# Patient Record
Sex: Male | Born: 1955 | Race: Black or African American | Hispanic: No | Smoking: Current every day smoker
Health system: Southern US, Community
[De-identification: ages and names within clinical notes are randomized; demographics above are authoritative.]

---

## 2018-09-10 ENCOUNTER — Emergency Department (HOSPITAL_COMMUNITY)
Admission: EM | Admit: 2018-09-10 | Discharge: 2018-09-11 | Disposition: A | Discharge status: Home / Self Care | Payer: Medicaid Other | Attending: Emergency Medicine | Admitting: Emergency Medicine

## 2018-09-10 ENCOUNTER — Encounter (HOSPITAL_COMMUNITY): Payer: Self-pay | Admitting: Emergency Medicine

## 2018-09-10 ENCOUNTER — Emergency Department (HOSPITAL_COMMUNITY): Payer: Medicaid Other

## 2018-09-10 ENCOUNTER — Other Ambulatory Visit: Payer: Self-pay

## 2018-09-10 DIAGNOSIS — M25512 Pain in left shoulder: Secondary | ICD-10-CM | POA: Diagnosis present

## 2018-09-10 DIAGNOSIS — Z5321 Procedure and treatment not carried out due to patient leaving prior to being seen by health care provider: Secondary | ICD-10-CM | POA: Insufficient documentation

## 2018-09-10 LAB — CBC
HCT: 41.5 % (ref 39.0–52.0)
Hemoglobin: 14 g/dL (ref 13.0–17.0)
MCH: 33.6 pg (ref 26.0–34.0)
MCHC: 33.7 g/dL (ref 30.0–36.0)
MCV: 99.5 fL (ref 80.0–100.0)
Platelets: 286 10*3/uL (ref 150–400)
RBC: 4.17 MIL/uL — ABNORMAL LOW (ref 4.22–5.81)
RDW: 13 % (ref 11.5–15.5)
WBC: 6.7 10*3/uL (ref 4.0–10.5)
nRBC: 0 % (ref 0.0–0.2)

## 2018-09-10 LAB — BASIC METABOLIC PANEL
Anion gap: 12 (ref 5–15)
BUN: 14 mg/dL (ref 8–23)
CO2: 23 mmol/L (ref 22–32)
Calcium: 9.6 mg/dL (ref 8.9–10.3)
Chloride: 103 mmol/L (ref 98–111)
Creatinine, Ser: 1.14 mg/dL (ref 0.61–1.24)
GFR calc Af Amer: >60 mL/min (ref >60)
GFR calc non Af Amer: >60 mL/min (ref >60)
Glucose, Bld: 99 mg/dL (ref 70–99)
Potassium: 4.1 mmol/L (ref 3.5–5.1)
Sodium: 138 mmol/L (ref 135–145)

## 2018-09-10 LAB — TROPONIN I (HIGH SENSITIVITY)
Troponin I (High Sensitivity): 5 ng/L (ref <18)
Troponin I (High Sensitivity): 4 ng/L (ref <18)

## 2018-09-10 MED ORDER — SODIUM CHLORIDE 0.9% FLUSH: 3.0000 mL | Freq: Once | INTRAVENOUS | Status: DC

## 2018-09-10 NOTE — ED Triage Notes (Signed)
Pt states he has bilateral shoulder pain for approx a month. Pt states the pain goes into his left side.Denies SOB/CP

## 2018-09-11 NOTE — ED Notes (Signed)
Pt has been called x 3 in lobby and outside, no answer

## 2020-08-10 ENCOUNTER — Other Ambulatory Visit: Payer: Self-pay

## 2020-08-10 ENCOUNTER — Emergency Department (HOSPITAL_COMMUNITY): Payer: Medicaid Other

## 2020-08-10 ENCOUNTER — Observation Stay (HOSPITAL_COMMUNITY): Payer: Medicaid Other

## 2020-08-10 ENCOUNTER — Encounter (HOSPITAL_COMMUNITY): Payer: Self-pay | Admitting: Internal Medicine

## 2020-08-10 ENCOUNTER — Observation Stay (HOSPITAL_COMMUNITY)
Admission: EM | Admit: 2020-08-10 | Discharge: 2020-08-11 | Disposition: A | Discharge status: Home / Self Care | Payer: Medicaid Other | Attending: Internal Medicine | Admitting: Internal Medicine

## 2020-08-10 DIAGNOSIS — D72829 Elevated white blood cell count, unspecified: Secondary | ICD-10-CM | POA: Diagnosis not present

## 2020-08-10 DIAGNOSIS — N179 Acute kidney failure, unspecified: Secondary | ICD-10-CM | POA: Diagnosis not present

## 2020-08-10 DIAGNOSIS — Z7901 Long term (current) use of anticoagulants: Secondary | ICD-10-CM | POA: Insufficient documentation

## 2020-08-10 DIAGNOSIS — R7401 Elevation of levels of liver transaminase levels: Secondary | ICD-10-CM

## 2020-08-10 DIAGNOSIS — Z79899 Other long term (current) drug therapy: Secondary | ICD-10-CM | POA: Insufficient documentation

## 2020-08-10 DIAGNOSIS — G9389 Other specified disorders of brain: Secondary | ICD-10-CM | POA: Insufficient documentation

## 2020-08-10 DIAGNOSIS — R569 Unspecified convulsions: Secondary | ICD-10-CM

## 2020-08-10 DIAGNOSIS — Z20822 Contact with and (suspected) exposure to covid-19: Secondary | ICD-10-CM | POA: Insufficient documentation

## 2020-08-10 DIAGNOSIS — Z87820 Personal history of traumatic brain injury: Secondary | ICD-10-CM | POA: Insufficient documentation

## 2020-08-10 DIAGNOSIS — R561 Post traumatic seizures: Principal | ICD-10-CM | POA: Insufficient documentation

## 2020-08-10 DIAGNOSIS — K0889 Other specified disorders of teeth and supporting structures: Secondary | ICD-10-CM | POA: Insufficient documentation

## 2020-08-10 DIAGNOSIS — F17210 Nicotine dependence, cigarettes, uncomplicated: Secondary | ICD-10-CM | POA: Diagnosis not present

## 2020-08-10 DIAGNOSIS — F10929 Alcohol use, unspecified with intoxication, unspecified: Secondary | ICD-10-CM | POA: Diagnosis not present

## 2020-08-10 LAB — I-STAT CHEM 8, ED
BUN: 10 mg/dL (ref 8–23)
Calcium, Ion: 1.1 mmol/L — ABNORMAL LOW (ref 1.15–1.40)
Chloride: 105 mmol/L (ref 98–111)
Creatinine, Ser: 1.6 mg/dL — ABNORMAL HIGH (ref 0.61–1.24)
Glucose, Bld: 174 mg/dL — ABNORMAL HIGH (ref 70–99)
HCT: 45 % (ref 39.0–52.0)
Hemoglobin: 15.3 g/dL (ref 13.0–17.0)
Potassium: 4.2 mmol/L (ref 3.5–5.1)
Sodium: 137 mmol/L (ref 135–145)
TCO2: 22 mmol/L (ref 22–32)

## 2020-08-10 LAB — CBC
HCT: 43.9 % (ref 39.0–52.0)
Hemoglobin: 14.8 g/dL (ref 13.0–17.0)
MCH: 33.2 pg (ref 26.0–34.0)
MCHC: 33.7 g/dL (ref 30.0–36.0)
MCV: 98.4 fL (ref 80.0–100.0)
Platelets: 226 10*3/uL (ref 150–400)
RBC: 4.46 MIL/uL (ref 4.22–5.81)
RDW: 14.4 % (ref 11.5–15.5)
WBC: 13.2 10*3/uL — ABNORMAL HIGH (ref 4.0–10.5)
nRBC: 0 % (ref 0.0–0.2)

## 2020-08-10 LAB — RESP PANEL BY RT-PCR (FLU A&B, COVID) ARPGX2
Influenza A by PCR: NEGATIVE
Influenza B by PCR: NEGATIVE
SARS Coronavirus 2 by RT PCR: NEGATIVE

## 2020-08-10 LAB — URINALYSIS, ROUTINE W REFLEX MICROSCOPIC
Bacteria, UA: NONE SEEN
Bilirubin Urine: NEGATIVE
Glucose, UA: NEGATIVE mg/dL
Ketones, ur: NEGATIVE mg/dL
Leukocytes,Ua: NEGATIVE
Nitrite: NEGATIVE
Protein, ur: 100 mg/dL — AB
Specific Gravity, Urine: 1.015 (ref 1.005–1.030)
pH: 5 (ref 5.0–8.0)

## 2020-08-10 LAB — PROTIME-INR
INR: 1 (ref 0.8–1.2)
Prothrombin Time: 13.2 seconds (ref 11.4–15.2)

## 2020-08-10 LAB — DIFFERENTIAL
Abs Immature Granulocytes: 0.12 10*3/uL — ABNORMAL HIGH (ref 0.00–0.07)
Basophils Absolute: 0 10*3/uL (ref 0.0–0.1)
Basophils Relative: 0 %
Eosinophils Absolute: 0.1 10*3/uL (ref 0.0–0.5)
Eosinophils Relative: 1 %
Immature Granulocytes: 1 %
Lymphocytes Relative: 7 %
Lymphs Abs: 1 10*3/uL (ref 0.7–4.0)
Monocytes Absolute: 0.4 10*3/uL (ref 0.1–1.0)
Monocytes Relative: 3 %
Neutro Abs: 11.6 10*3/uL — ABNORMAL HIGH (ref 1.7–7.7)
Neutrophils Relative %: 88 %

## 2020-08-10 LAB — COMPREHENSIVE METABOLIC PANEL
ALT: 55 U/L — ABNORMAL HIGH (ref 0–44)
AST: 49 U/L — ABNORMAL HIGH (ref 15–41)
Albumin: 3.8 g/dL (ref 3.5–5.0)
Alkaline Phosphatase: 79 U/L (ref 38–126)
Anion gap: 16 — ABNORMAL HIGH (ref 5–15)
BUN: 9 mg/dL (ref 8–23)
CO2: 20 mmol/L — ABNORMAL LOW (ref 22–32)
Calcium: 9.5 mg/dL (ref 8.9–10.3)
Chloride: 100 mmol/L (ref 98–111)
Creatinine, Ser: 1.67 mg/dL — ABNORMAL HIGH (ref 0.61–1.24)
GFR, Estimated: 45 mL/min — ABNORMAL LOW (ref >60)
Glucose, Bld: 183 mg/dL — ABNORMAL HIGH (ref 70–99)
Potassium: 4.2 mmol/L (ref 3.5–5.1)
Sodium: 136 mmol/L (ref 135–145)
Total Bilirubin: 0.7 mg/dL (ref 0.3–1.2)
Total Protein: 7.5 g/dL (ref 6.5–8.1)

## 2020-08-10 LAB — RAPID URINE DRUG SCREEN, HOSP PERFORMED
Amphetamines: NOT DETECTED
Barbiturates: NOT DETECTED
Benzodiazepines: POSITIVE — AB
Cocaine: NOT DETECTED
Opiates: NOT DETECTED
Tetrahydrocannabinol: NOT DETECTED

## 2020-08-10 LAB — SODIUM, URINE, RANDOM: Sodium, Ur: 116 mmol/L

## 2020-08-10 LAB — CREATININE, URINE, RANDOM: Creatinine, Urine: 152.02 mg/dL

## 2020-08-10 LAB — ETHANOL: Alcohol, Ethyl (B): <10 mg/dL (ref <10)

## 2020-08-10 LAB — APTT: aPTT: 23 seconds — ABNORMAL LOW (ref 24–36)

## 2020-08-10 LAB — CBG MONITORING, ED: Glucose-Capillary: 194 mg/dL — ABNORMAL HIGH (ref 70–99)

## 2020-08-10 MED ORDER — THIAMINE HCL 100 MG/ML IJ SOLN
500.0000 mg | Freq: Three times a day (TID) | INTRAVENOUS | Status: DC
  Administered 2020-08-11 (×2): 500 mg via INTRAVENOUS
  Filled 2020-08-10 (×7): qty 5

## 2020-08-10 MED ORDER — NICOTINE 7 MG/24HR TD PT24
7.0000 mg | MEDICATED_PATCH | Freq: Every day | TRANSDERMAL | Status: DC
  Administered 2020-08-10 – 2020-08-11 (×2): 7 mg via TRANSDERMAL
  Filled 2020-08-10 (×2): qty 1

## 2020-08-10 MED ORDER — LEVETIRACETAM IN NACL 1000 MG/100ML IV SOLN
1000.0000 mg | Freq: Once | INTRAVENOUS | Status: AC
  Administered 2020-08-10: 1000 mg via INTRAVENOUS
  Filled 2020-08-10: qty 100

## 2020-08-10 MED ORDER — ACETAMINOPHEN 650 MG RE SUPP: 650.0000 mg | RECTAL | Status: DC | PRN

## 2020-08-10 MED ORDER — THIAMINE HCL 100 MG/ML IJ SOLN: 250.0000 mg | Freq: Every day | INTRAVENOUS | Status: DC

## 2020-08-10 MED ORDER — THIAMINE HCL 100 MG PO TABS: 100.0000 mg | ORAL_TABLET | Freq: Every day | ORAL | Status: DC

## 2020-08-10 MED ORDER — LORAZEPAM 1 MG PO TABS: 1.0000 mg | ORAL_TABLET | ORAL | Status: DC | PRN

## 2020-08-10 MED ORDER — ONDANSETRON HCL 4 MG PO TABS
4.0000 mg | ORAL_TABLET | Freq: Four times a day (QID) | ORAL | Status: DC | PRN
Start: 2020-08-10 — End: 2020-08-11

## 2020-08-10 MED ORDER — ADULT MULTIVITAMIN W/MINERALS CH
1.0000 | ORAL_TABLET | Freq: Every day | ORAL | Status: DC
  Administered 2020-08-11: 1 via ORAL
  Filled 2020-08-10: qty 1

## 2020-08-10 MED ORDER — ACETAMINOPHEN 325 MG PO TABS
650.0000 mg | ORAL_TABLET | ORAL | Status: DC | PRN
  Administered 2020-08-10: 650 mg via ORAL
  Filled 2020-08-10: qty 2

## 2020-08-10 MED ORDER — LEVETIRACETAM IN NACL 500 MG/100ML IV SOLN
500.0000 mg | Freq: Two times a day (BID) | INTRAVENOUS | Status: DC
  Administered 2020-08-11 (×2): 500 mg via INTRAVENOUS
  Filled 2020-08-10 (×3): qty 100

## 2020-08-10 MED ORDER — LORAZEPAM 2 MG/ML IJ SOLN
1.0000 mg | INTRAMUSCULAR | Status: DC | PRN
  Administered 2020-08-10: 1 mg via INTRAVENOUS
  Filled 2020-08-10: qty 1

## 2020-08-10 MED ORDER — ORAL CARE MOUTH RINSE
15.0000 mL | Freq: Two times a day (BID) | OROMUCOSAL | Status: DC
  Administered 2020-08-11: 15 mL via OROMUCOSAL

## 2020-08-10 MED ORDER — LIDOCAINE VISCOUS HCL 2 % MT SOLN
15.0000 mL | OROMUCOSAL | Status: DC | PRN
  Administered 2020-08-10 – 2020-08-11 (×2): 15 mL via OROMUCOSAL
  Filled 2020-08-10 (×3): qty 15

## 2020-08-10 MED ORDER — FOLIC ACID 1 MG PO TABS
1.0000 mg | ORAL_TABLET | Freq: Every day | ORAL | Status: DC
  Administered 2020-08-11: 1 mg via ORAL
  Filled 2020-08-10: qty 1

## 2020-08-10 MED ORDER — LACTATED RINGERS IV BOLUS
1000.0000 mL | Freq: Once | INTRAVENOUS | Status: AC
  Administered 2020-08-10: 1000 mL via INTRAVENOUS

## 2020-08-10 MED ORDER — ONDANSETRON HCL 4 MG/2ML IJ SOLN: 4.0000 mg | Freq: Four times a day (QID) | INTRAMUSCULAR | Status: DC | PRN

## 2020-08-10 MED ORDER — ENOXAPARIN SODIUM 40 MG/0.4ML IJ SOSY
40.0000 mg | PREFILLED_SYRINGE | INTRAMUSCULAR | Status: DC
  Administered 2020-08-10: 40 mg via SUBCUTANEOUS
  Filled 2020-08-10: qty 0.4

## 2020-08-10 MED ORDER — THIAMINE HCL 100 MG/ML IJ SOLN: 100.0000 mg | Freq: Every day | INTRAMUSCULAR | Status: DC

## 2020-08-10 NOTE — ED Notes (Signed)
Admitting paged to RN per her request 

## 2020-08-10 NOTE — ED Notes (Signed)
Report called to 3W given to [place], [place]

## 2020-08-10 NOTE — ED Notes (Signed)
Brad Jenkins, pt's daughter contact info: [phone]

## 2020-08-10 NOTE — ED Notes (Signed)
Pt transported to MRI 

## 2020-08-10 NOTE — Progress Notes (Signed)
Per RN, patient is unavailable for EEG.  She asked that we check with her in half an hour.

## 2020-08-10 NOTE — ED Notes (Signed)
This RN witnessed pt attempting to get out of bed. Pt gathering belongings and wanting, "to go home". Pt difficult to redirect. Pt daughter called. Pt appears amiable to staying at this time.

## 2020-08-10 NOTE — Consult Note (Addendum)
NEUROLOGY CONSULTATION NOTE   Date of service: August 10, 2020 Patient Name: Brad Jenkins MRN:  [id number] DOB:  03-13-1955 Reason for consult: "Seizures, hx of TBI and EtOH use" Requesting Provider: Gust Rung, DO _ _ _   _ __   _ __ _ _  __ __   _ __   __ _  History of Present Illness  Brad Jenkins is a 65 y.o. male with PMH significant for prior TBI in 1997 and several other instances of head trauma with R more than L bi-frontal encephalomalacia on imaging, hx of EtOH use ~3-4 beers a day, last drink was the night before presentation, hx of seizures since he was a kid for which he follows with a neurologist but I do not have access to the relevant notes and is not on any AEDs, he was brought in by EMS for seizures x 2.  Daughter was at home and in the morning, found him on the ground twitching. EMS arrived and he was still seizing. The seizure resolved prior to any medications. He had a second seizure enroute and was given 5mg  of Midazolam with resolution of seizures. The second seizure lasted 30-45 secs. He was post ictal on arrival to ED. Reports approximately 1 seizure a year since the TBI.  Patient has very poor recollection of the actual event. Overall, he is a very poor historian and looks to his daughter and son to help with answering pretty much any question that I ask. Drinks about 28oz of beers a day on average, can drink more than that sometimes. Last drink was the night before he came in. Several times, he would skip his meals and substitute that for drinking alcohol.  His brother has diagnosed epilepsy in childhood.  Workup in the ED with normal vitals, alcohol undetectable in his blood, mild leukocytosis, elevated creatinine to 1.67,  transaminitis, UA is not particularly concerning for a UTI, UDS positive for Benzos that may just be from midazolam he was given by EMS enroute, MRI Brain with R more than left bi-frontal encephalomalacia.  We were asked to evaluate  him for need for Anti Seizure meds going forward.   ROS   Constitutional Denies weight loss, fever and chills.   HEENT Denies changes in vision and hearing.   Respiratory Denies SOB and cough.   CV Denies palpitations and CP   GI Denies abdominal pain, nausea, vomiting and diarrhea.   GU Denies dysuria and urinary frequency.   MSK Denies myalgia and joint pain.   Skin Denies rash and pruritus.   Neurological Denies headache and syncope.   Psychiatric Denies recent changes in mood. Denies anxiety and depression.    Past History  No past medical history on file. No past surgical history on file. No family history on file. Social History   Socioeconomic History   Marital status: Unknown    Spouse name: Not on file   Number of children: Not on file   Years of education: Not on file   Highest education level: Not on file  Occupational History   Not on file  Tobacco Use   Smoking status: Every Day    Types: Cigarettes   Smokeless tobacco: Never  Substance and Sexual Activity   Alcohol use: Yes   Drug use: Not Currently   Sexual activity: Not on file  Other Topics Concern   Not on file  Social History Narrative   Not on file   Social Determinants of  Health   Financial Resource Strain: Not on file  Food Insecurity: Not on file  Transportation Needs: Not on file  Physical Activity: Not on file  Stress: Not on file  Social Connections: Not on file   No Known Allergies  Medications  (Not in a hospital admission)    Vitals   Vitals:   08/10/20 1355 08/10/20 1436 08/10/20 1500 08/10/20 1711  BP:  133/82 (!) 145/90 (!) 165/91  Pulse:  78 71 75  Resp:   19 19  Temp: 98.6 F (37 C)     TempSrc: Oral     SpO2:   99% 100%     There is no height or weight on file to calculate BMI.  Physical Exam   General: Laying comfortably in bed; in no acute distress.  HENT: Normal oropharynx and mucosa. Normal external appearance of ears and nose.  Neck: Supple, no pain or  tenderness  CV: No JVD. No peripheral edema.  Pulmonary: Symmetric Chest rise. Normal respiratory effort.  Abdomen: Soft to touch, non-tender.  Ext: No cyanosis, edema, or deformity  Skin: No rash. Normal palpation of skin.   Musculoskeletal: Normal digits and nails by inspection. No clubbing.   Neurologic Examination  Mental status/Cognition: Alert, oriented to self, place, month and year, good attention.  Speech/language: Fluent, comprehension intact, object naming intact, repetition intact.  Cranial nerves:   CN II Pupils equal and reactive to light, no VF deficits    CN III,IV,VI EOM intact, no gaze preference or deviation, no nystagmus    CN V normal sensation in V1, V2, and V3 segments bilaterally    CN VII no asymmetry, no nasolabial fold flattening    CN VIII normal hearing to speech    CN IX & X normal palatal elevation, no uvular deviation    CN XI 5/5 head turn and 5/5 shoulder shrug bilaterally    CN XII midline tongue protrusion    Motor:  Muscle bulk: poor, tone normal, pronator drift none tremor none Mvmt Root Nerve  Muscle Right Left Comments  SA C5/6 Ax Deltoid 5 5   EF C5/6 Mc Biceps 5 5   EE C6/7/8 Rad Triceps 5 5   WF C6/7 Med FCR     WE C7/8 PIN ECU     F Ab C8/T1 U ADM/FDI 5 5   HF L1/2/3 Fem Illopsoas 5 5   KE L2/3/4 Fem Quad 5 5   DF L4/5 D Peron Tib Ant 5 5   PF S1/2 Tibial Grc/Sol 5 5    Reflexes:  Right Left Comments  Pectoralis      Biceps (C5/6) 2 2   Brachioradialis (C5/6) 2 2    Triceps (C6/7) 2 2    Patellar (L3/4) 2 2    Achilles (S1)      Hoffman      Plantar     Jaw jerk    Sensation:  Light touch intact   Pin prick    Temperature    Vibration   Proprioception    Coordination/Complex Motor:  - Finger to Nose intact - Heel to shin intact - Rapid alternating movement are normal - Gait: Unsafe given recent seizures.  Labs   CBC:  Recent Labs  Lab 08/10/20 0902 08/10/20 0904  WBC 13.2*  --   NEUTROABS 11.6*  --   HGB  14.8 15.3  HCT 43.9 45.0  MCV 98.4  --   PLT 226  --     Basic  Metabolic Panel:  Lab Results  Component Value Date   NA 137 08/10/2020   K 4.2 08/10/2020   CO2 20 (L) 08/10/2020   GLUCOSE 174 (H) 08/10/2020   BUN 10 08/10/2020   CREATININE 1.60 (H) 08/10/2020   CALCIUM 9.5 08/10/2020   GFRNONAA 45 (L) 08/10/2020   GFRAA >60 09/10/2018   Lipid Panel: No results found for: LDLCALC HgbA1c: No results found for: HGBA1C Urine Drug Screen:     Component Value Date/Time   LABOPIA NONE DETECTED 08/10/2020 1200   COCAINSCRNUR NONE DETECTED 08/10/2020 1200   LABBENZ POSITIVE (A) 08/10/2020 1200   AMPHETMU NONE DETECTED 08/10/2020 1200   THCU NONE DETECTED 08/10/2020 1200   LABBARB NONE DETECTED 08/10/2020 1200    Alcohol Level     Component Value Date/Time   ETH <10 08/10/2020 0902    CT Head without contrast: Personally reviewed and CTH was negative for a large hypodensity concerning for a large territory infarct or hyperdensity concerning for an ICH  MRI Brain(personally reviewed): 1. No acute intracranial abnormality. 2. Right greater than left frontal lobe encephalomalacia which could be posttraumatic or ischemic. 3. Moderate chronic small vessel ischemic disease.  rEEG:  pending  Impression   Brad Jenkins is a 65 y.o. male with PMH significant for prior TBI, seizures since childhood and Etoh use who presents with seizures x 2 with undetectable Etoh levels. Exam with some concern for potential confabulation. Has several seizure risk factors including brother with epilepsy, several instance sof TBI in the past,  about 1 seizure a year and several decades of EtOh use. Althou on the surface, this may appear to be a simple case of alcohol withdrawal seizure specially with low alcohol levels, his case is complex due to his prior TBI and other seizure risk factors and I would favor starting him on AEDs.   Seizures with epilepsy. Alcohol use disorder History of  TBI Bifrontal Encephalomalacia.  Recommendations   - Agree with continuing Keppra 500mg  BID. Discussed behavioral side effects with patient and family. - He was already started on thiamine here so no point in checking levels. Will increase thiamine to Wernickes dose and can be transitioned to PO thiamine 100mg  daily at discharge. This is due to concerns for confabulation. - Will also check for Vit B12, folate, TSH given concern for memory loss. - Discussed with him and family that he should not quit alcohol at home. He is high risk for seizures if he attempts to quit alcohol at home. He should come to the hospital where we can safely monitor him for withdrawal seizures when he is ready to quit. - Seizure precautions with seizure pads. - routine EEG is pending. Can be done outpatient too. I would still continue Keppra even if the rEEG is negative. - Ativan 1mg  IV PRN for seizure lasting more than 3 mins. - continue CIWA protocol. - Follow up with neurology outpatient - Full discharge seizure precautions listed below. I confirmed with him and his family that he does not drive. ______________________________________________________________________   Thank you for the opportunity to take part in the care of this patient. If you have any further questions, please contact the neurology consultation attending.  Signed,  Erick BlinksSalman Dijon Kohlman [facility] Pager Number [phone] _ _ _   _ __   _ __ _ _  __ __   _ __   __ _    Seizure precautions: [place] [place] statutes, patients with seizures are not allowed to  drive until they have been seizure-free for six months and cleared by a physician    Use caution when using heavy equipment or power tools. Avoid working on ladders or at heights. Take showers instead of baths. Ensure the water temperature is not too high on the home water heater. Do not go swimming alone. Do not lock yourself in a room alone (i.e. bathroom). When  caring for infants or small children, sit down when holding, feeding, or changing them to minimize risk of injury to the child in the event you have a seizure. Maintain good sleep hygiene. Avoid alcohol.    If patient has another seizure, call 911 and bring them back to the ED if: A.  The seizure lasts longer than 5 minutes.      B.  The patient doesn't wake shortly after the seizure or has new problems such as difficulty seeing, speaking or moving following the seizure C.  The patient was injured during the seizure D.  The patient has a temperature over 102 F (39C) E.  The patient vomited during the seizure and now is having trouble breathing    During the Seizure   - First, ensure adequate ventilation and place patients on the floor on their left side  Loosen clothing around the neck and ensure the airway is patent. If the patient is clenching the teeth, do not force the mouth open with any object as this can cause severe damage - Remove all items from the surrounding that can be hazardous. The patient may be oblivious to what's happening and may not even know what he or she is doing. If the patient is confused and wandering, either gently guide him/her away and block access to outside areas - Reassure the individual and be comforting - Call 911. In most cases, the seizure ends before EMS arrives. However, there are cases when seizures may last over 3 to 5 minutes. Or the individual may have developed breathing difficulties or severe injuries. If a pregnant patient or a person with diabetes develops a seizure, it is prudent to call an ambulance. - Finally, if the patient does not regain full consciousness, then call EMS. Most patients will remain confused for about 45 to 90 minutes after a seizure, so you must use judgment in calling for help. - Avoid restraints but make sure the patient is in a bed with padded side rails - Place the individual in a lateral position with the neck slightly  flexed; this will help the saliva drain from the mouth and prevent the tongue from falling backward - Remove all nearby furniture and other hazards from the area - Provide verbal assurance as the individual is regaining consciousness - Provide the patient with privacy if possible - Call for help and start treatment as ordered by the caregiver    After the Seizure (Postictal Stage)   After a seizure, most patients experience confusion, fatigue, muscle pain and/or a headache. Thus, one should permit the individual to sleep. For the next few days, reassurance is essential. Being calm and helping reorient the person is also of importance.   Most seizures are painless and end spontaneously. Seizures are not harmful to others but can lead to complications such as stress on the lungs, brain and the heart. Individuals with prior lung problems may develop labored breathing and respiratory distress.

## 2020-08-10 NOTE — Evaluation (Signed)
Physical Therapy Evaluation Patient Details Name: Brad Jenkins MRN: [id number] DOB: [birth year] Today's Date: 08/10/2020   History of Present Illness  Pt is a 65 y.o. male who presented 08/10/20 with seizure-like activity. CT Head without acute intracranial abnormality but did show extensive chronic encephalomalacia in bil frontal lobe (R>L) with small region in left parafalcine frontal lobe. PMH: seizures secondary to TBI and alcohol use disorder   Clinical Impression  Pt presents with condition above and deficits mentioned below, see PT Problem List. PTA, he was independent and living with his adult daughter and adult grandson in a 2nd level apartment that does not have an elevator. He has to navigate another flight of stairs to access the shower once inside the apartment also. Currently, pt displays deficits in lower extremity coordination, balance, and possible cognition. Pt may be at baseline cognitively, being unaware of his deficits and perseverating on objects/tasks/desires, as he but it was unclear due to his continual use of humor throughout the session. Pt is at risk for falls currently (DGI score of 16 today), displaying moments of staggering when he would change head positions or have his feet in a narrow stance, but he was able to recover with min guard assist. Expect pt to return to his baseline quickly, thus no PT follow-up as all needs can be addressed acutely. Recommending 24/7 supervision initially until his balance and possible cognitive deficits improve, daughter made aware of this recommendation and verbalized understanding.      Follow Up Recommendations No PT follow up;Supervision/Assistance - 24 hour (initally until balance and possible cognition deficits improve)    Equipment Recommendations  None recommended by PT    Recommendations for Other Services OT consult     Precautions / Restrictions Precautions Precautions: Fall Precaution Comments:  seizures Restrictions Weight Bearing Restrictions: No      Mobility  Bed Mobility Overal bed mobility: Modified Independent             General bed mobility comments: Pt able to transition supine > sit EOB with HOB elevated safely.    Transfers Overall transfer level: Needs assistance Equipment used: None Transfers: Sit to/from Stand Sit to Stand: Min guard         General transfer comment: Min guard for safety, but no LOB with pt quickly coming to stand.  Ambulation/Gait Ambulation/Gait assistance: Min guard Gait Distance (Feet): 420 Feet Assistive device: None Gait Pattern/deviations: Step-through pattern;Narrow base of support;Staggering left;Staggering right Gait velocity: WNL Gait velocity interpretation: >4.37 ft/sec, indicative of normal walking speed General Gait Details: Pt ambulates with narrow BOS, intermittently crossing one foot slightly in front of other causing minor LOB in which pt recovers with reactional steps and min guard assist. Imbalance noted to increase with changes in head positions and quick turns.  Stairs            Wheelchair Mobility    Modified Rankin (Stroke Patients Only) Modified Rankin (Stroke Patients Only) Pre-Morbid Rankin Score: No symptoms Modified Rankin: Moderately severe disability     Balance Overall balance assessment: Mild deficits observed, not formally tested                               Standardized Balance Assessment Standardized Balance Assessment : Dynamic Gait Index   Dynamic Gait Index Level Surface: Mild Impairment Change in Gait Speed: Normal Gait with Horizontal Head Turns: Mild Impairment Gait with Vertical Head Turns: Mild Impairment Gait and  Pivot Turn: Moderate Impairment Step Over Obstacle: Mild Impairment Step Around Obstacles: Mild Impairment Steps: Mild Impairment (assumed, not tested) Total Score: 16       Pertinent Vitals/Pain Pain Assessment: Faces Faces Pain  Scale: Hurts a little bit Pain Location: toothache and headache Pain Descriptors / Indicators: Headache Pain Intervention(s): Limited activity within patient's tolerance;Monitored during session;Repositioned    Home Living Family/patient expects to be discharged to:: Private residence Living Arrangements: Children;Other relatives (adult daughter and adult grandson) Available Help at Discharge: Family;Friend(s);Available 24 hours/day;Neighbor Type of Home: Apartment Home Access: Stairs to enter Entrance Stairs-Rails: Right (ascending) Entrance Stairs-Number of Steps: 13 Home Layout: Two level;1/2 bath on main level (bedroom main level, full bath 2nd floor) Home Equipment: Walker - 4 wheels;Bedside commode;Shower seat      Prior Function Level of Independence: Independent               Hand Dominance        Extremity/Trunk Assessment   Upper Extremity Assessment Upper Extremity Assessment: Overall WFL for tasks assessed (fairly symmetrical and intact bil strength with gross MMT of 5; denies numbness/tingling)    Lower Extremity Assessment Lower Extremity Assessment: RLE deficits/detail;LLE deficits/detail RLE Deficits / Details: incoordination noted; fairly symmetrical and intact bil strength with gross MMT of 5; denies numbness/tingling RLE Coordination: decreased gross motor LLE Deficits / Details: incoordination noted; fairly symmetrical and intact bil strength with gross MMT of 5; denies numbness/tingling LLE Coordination: decreased gross motor    Cervical / Trunk Assessment Cervical / Trunk Assessment: Normal  Communication   Communication: No difficulties  Cognition Arousal/Alertness: Awake/alert Behavior During Therapy: Flat affect Overall Cognitive Status: Difficult to assess                                 General Comments: Pt with sarcastic humor, possibly covering up cognitive deficits. Pt's daughter denies any noticable changes from  baseline though. Pt does perseverate on wanting to leave hospital, needing reminders of process. Pt problem-solved to find room through calling out to his daughter to answer for him to follow her voice. Poor awareness into his balance deficits.      General Comments      Exercises     Assessment/Plan    PT Assessment Patient needs continued PT services  PT Problem List Decreased balance;Decreased mobility;Decreased coordination;Decreased safety awareness       PT Treatment Interventions DME instruction;Gait training;Stair training;Functional mobility training;Therapeutic activities;Therapeutic exercise;Balance training;Neuromuscular re-education;Cognitive remediation;Patient/family education    PT Goals (Current goals can be found in the Care Plan section)  Acute Rehab PT Goals Patient Stated Goal: to go home PT Goal Formulation: With patient/family Time For Goal Achievement: 08/17/20 Potential to Achieve Goals: Good    Frequency Min 3X/week   Barriers to discharge        Co-evaluation               AM-PAC PT "6 Clicks" Mobility  Outcome Measure Help needed turning from your back to your side while in a flat bed without using bedrails?: None Help needed moving from lying on your back to sitting on the side of a flat bed without using bedrails?: None Help needed moving to and from a bed to a chair (including a wheelchair)?: A Little Help needed standing up from a chair using your arms (e.g., wheelchair or bedside chair)?: A Little Help needed to walk in hospital room?: A Little Help  needed climbing 3-5 steps with a railing? : A Little 6 Click Score: 20    End of Session Equipment Utilized During Treatment: Gait belt Activity Tolerance: Patient tolerated treatment well Patient left: in bed;with call bell/phone within reach;with family/visitor present Nurse Communication: Mobility status PT Visit Diagnosis: Unsteadiness on feet (R26.81);Other abnormalities of gait  and mobility (R26.89);Difficulty in walking, not elsewhere classified (R26.2)    Time: 5885-0277 PT Time Calculation (min) (ACUTE ONLY): 31 min   Charges:   PT Evaluation $PT Eval Low Complexity: 1 Low PT Treatments $Gait Training: 8-22 mins        Raymond Gurney, PT, DPT Acute Rehabilitation Services  Pager: [phone] Office: [phone]   Jewel Baize 08/10/2020, 6:38 PM

## 2020-08-10 NOTE — ED Notes (Signed)
Pt walking around. Pt ripping off lines. Pt verbalizing wanting to go home. MD Sande Brothers paged. Family at bedside.

## 2020-08-10 NOTE — ED Notes (Signed)
Patient is resting comfortably. With family at bedside. Meal tray given and medicated per order. Pt has not other requests at this time

## 2020-08-10 NOTE — ED Notes (Signed)
Pt transported to [facility]

## 2020-08-10 NOTE — ED Provider Notes (Signed)
[facility]GENCY [facility] Provider Note   CSN: [id number] Arrival date & time: 08/10/20  0846     History Chief Complaint  Patient presents with   Seizures    Brad Jenkins is a 65 y.o. male.  Patient is a 65 year old male with a history of alcohol abuse who presents with seizures.  Per family, he was last seen normal around 9 to 10:00 PM last night.  He was found on the floor this morning and was reported to have had 2 seizures by family prior to EMS arrival.  EMS reported that he had 2 additional tonic-clonic generalized seizures in route.  The first 1 stopped within about 25 seconds.  The second 1 lasted about 45 seconds and patient was given 2.5 mg of midazolam.  It is reported by EMS that he has a history of seizures related to alcohol withdrawal.  It is unclear when his last alcohol intake was.  No other history is known about the patient.  It is unknown if he is on medications for his seizures.  Patient is confused and postictal appearing at this time.  History is limited due to this.      No past medical history on file.  There are no problems to display for this patient.   No past surgical history on file.     No family history on file.  Social History   Tobacco Use   Smoking status: Every Day    Types: Cigarettes   Smokeless tobacco: Never  Substance Use Topics   Alcohol use: Yes   Drug use: Not Currently    Home Medications Prior to Admission medications   Not on File    Allergies    Patient has no known allergies.  Review of Systems   Review of Systems  Unable to perform ROS: Mental status change   Physical Exam Updated Vital Signs BP 118/74   Pulse 83   Temp 97.8 F (36.6 C) (Oral)   Resp (!) 22   SpO2 98%   Physical Exam Constitutional:      Appearance: He is well-developed.     Comments: Smells of EtOH  HENT:     Head: Normocephalic and atraumatic.  Eyes:     Pupils: Pupils are equal, round, and  reactive to light.     Comments: On initial evaluation, patient would look to the right but I cannot get him to look to the left past the midline  Cardiovascular:     Rate and Rhythm: Normal rate and regular rhythm.     Heart sounds: Normal heart sounds.  Pulmonary:     Effort: Pulmonary effort is normal. No respiratory distress.     Breath sounds: Normal breath sounds. No wheezing or rales.  Chest:     Chest wall: No tenderness.  Abdominal:     General: Bowel sounds are normal.     Palpations: Abdomen is soft.     Tenderness: There is no abdominal tenderness. There is no guarding or rebound.  Musculoskeletal:        General: Normal range of motion.     Cervical back: Normal range of motion and neck supple.  Lymphadenopathy:     Cervical: No cervical adenopathy.  Skin:    General: Skin is warm and dry.     Findings: No rash.  Neurological:     Mental Status: He is alert.     Comments: Will open his eyes with verbal stimuli.  Will  tell me his name but cannot elicit other verbal response.  No obvious facial drooping but patient will not cooperate with exam.  He has no movement of his left arm and left leg.  He has good movement of his right side.  He withdraws to Babinski testing on the right but not on the left    ED Results / Procedures / Treatments   Labs (all labs ordered are listed, but only abnormal results are displayed) Labs Reviewed  APTT - Abnormal; Notable for the following components:      Result Value   aPTT 23 (*)    All other components within normal limits  CBC - Abnormal; Notable for the following components:   WBC 13.2 (*)    All other components within normal limits  DIFFERENTIAL - Abnormal; Notable for the following components:   Neutro Abs 11.6 (*)    Abs Immature Granulocytes 0.12 (*)    All other components within normal limits  COMPREHENSIVE METABOLIC PANEL - Abnormal; Notable for the following components:   CO2 20 (*)    Glucose, Bld 183 (*)     Creatinine, Ser 1.67 (*)    AST 49 (*)    ALT 55 (*)    GFR, Estimated 45 (*)    Anion gap 16 (*)    All other components within normal limits  CBG MONITORING, ED - Abnormal; Notable for the following components:   Glucose-Capillary 194 (*)    All other components within normal limits  I-STAT CHEM 8, ED - Abnormal; Notable for the following components:   Creatinine, Ser 1.60 (*)    Glucose, Bld 174 (*)    Calcium, Ion 1.10 (*)    All other components within normal limits  RESP PANEL BY RT-PCR (FLU A&B, COVID) ARPGX2  ETHANOL  PROTIME-INR  RAPID URINE DRUG SCREEN, HOSP PERFORMED  URINALYSIS, ROUTINE W REFLEX MICROSCOPIC    EKG EKG Interpretation  Date/Time:  Thursday August 10 2020 09:03:13 EDT Ventricular Rate:  110 PR Interval:  141 QRS Duration: 87 QT Interval:  341 QTC Calculation: 462 R Axis:   25 Text Interpretation: Sinus tachycardia LAE, consider biatrial enlargement since last tracing no significant change Confirmed by Rolan Bucco [phone]) on 08/10/2020 9:26:04 AM  Radiology CT Head Wo Contrast  Result Date: 08/10/2020 CLINICAL DATA:  Patient found down.  Probable seizure. EXAM: CT HEAD WITHOUT CONTRAST CT CERVICAL SPINE WITHOUT CONTRAST TECHNIQUE: Multidetector CT imaging of the head and cervical spine was performed following the standard protocol without intravenous contrast. Multiplanar CT image reconstructions of the cervical spine were also generated. COMPARISON:  None. FINDINGS: CT HEAD FINDINGS Brain: There is no evidence for acute hemorrhage, hydrocephalus, mass lesion, or abnormal extra-axial fluid collection. No definite CT evidence for acute infarction. Diffuse loss of parenchymal volume is consistent with atrophy. Patchy low attenuation in the deep hemispheric and periventricular white matter is nonspecific, but likely reflects chronic microvascular ischemic demyelination. Extensive encephalomalacia noted in the right frontal lobe with small region of  encephalomalacia noted left para falcine frontal lobe. Vascular: No hyperdense vessel or unexpected calcification. Skull: No evidence for fracture. No worrisome lytic or sclerotic lesion. Sinuses/Orbits: Complete opacification noted left maxillary sinus with sclerotic margins consistent with chronic disease. There is some minimal opacification of anterior left ethmoid air cells although only trace mucosal disease noted left frontal sinus. Visualized portions of the globes and intraorbital fat are unremarkable. Other: None. CT CERVICAL SPINE FINDINGS Alignment: Straightening of normal cervical lordosis without subluxation.  Skull base and vertebrae: No acute fracture. No primary bone lesion or focal pathologic process. Soft tissues and spinal canal: No prevertebral fluid or swelling. No visible canal hematoma. Disc levels: Mild loss of disc height noted C5-6. Upper facet degeneration noted on the right. Left C4-5 facets are fused. Upper chest: Negative. Other: None. IMPRESSION: 1. No acute intracranial abnormality. Atrophy with chronic small vessel white matter ischemic disease. 2. Extensive chronic encephalomalacia in the right frontal lobe with small region of encephalomalacia noted left para falcine frontal lobe. 3. No cervical spine fracture. 4. Loss of cervical lordosis. This can be related to patient positioning, muscle spasm or soft tissue injury. Electronically Signed   By: Kennith Center M.D.   On: 08/10/2020 10:07   CT Cervical Spine Wo Contrast  Result Date: 08/10/2020 CLINICAL DATA:  Patient found down.  Probable seizure. EXAM: CT HEAD WITHOUT CONTRAST CT CERVICAL SPINE WITHOUT CONTRAST TECHNIQUE: Multidetector CT imaging of the head and cervical spine was performed following the standard protocol without intravenous contrast. Multiplanar CT image reconstructions of the cervical spine were also generated. COMPARISON:  None. FINDINGS: CT HEAD FINDINGS Brain: There is no evidence for acute hemorrhage,  hydrocephalus, mass lesion, or abnormal extra-axial fluid collection. No definite CT evidence for acute infarction. Diffuse loss of parenchymal volume is consistent with atrophy. Patchy low attenuation in the deep hemispheric and periventricular white matter is nonspecific, but likely reflects chronic microvascular ischemic demyelination. Extensive encephalomalacia noted in the right frontal lobe with small region of encephalomalacia noted left para falcine frontal lobe. Vascular: No hyperdense vessel or unexpected calcification. Skull: No evidence for fracture. No worrisome lytic or sclerotic lesion. Sinuses/Orbits: Complete opacification noted left maxillary sinus with sclerotic margins consistent with chronic disease. There is some minimal opacification of anterior left ethmoid air cells although only trace mucosal disease noted left frontal sinus. Visualized portions of the globes and intraorbital fat are unremarkable. Other: None. CT CERVICAL SPINE FINDINGS Alignment: Straightening of normal cervical lordosis without subluxation. Skull base and vertebrae: No acute fracture. No primary bone lesion or focal pathologic process. Soft tissues and spinal canal: No prevertebral fluid or swelling. No visible canal hematoma. Disc levels: Mild loss of disc height noted C5-6. Upper facet degeneration noted on the right. Left C4-5 facets are fused. Upper chest: Negative. Other: None. IMPRESSION: 1. No acute intracranial abnormality. Atrophy with chronic small vessel white matter ischemic disease. 2. Extensive chronic encephalomalacia in the right frontal lobe with small region of encephalomalacia noted left para falcine frontal lobe. 3. No cervical spine fracture. 4. Loss of cervical lordosis. This can be related to patient positioning, muscle spasm or soft tissue injury. Electronically Signed   By: Kennith Center M.D.   On: 08/10/2020 10:07    Procedures Procedures   Medications Ordered in ED Medications   levETIRAcetam (KEPPRA) IVPB 1000 mg/100 mL premix (1,000 mg Intravenous New Bag/Given 08/10/20 0944)    ED Course  I have reviewed the triage vital signs and the nursing notes.  Pertinent labs & imaging results that were available during my care of the patient were reviewed by me and considered in my medical decision making (see chart for details).    MDM Rules/Calculators/A&P                           08:50 on initial patient arrival, patient seemed to have gaze deviation with left side weakness.  Even though he had had seizure activity, with the  significant apparent deficits, code stroke was going to be activated.  However while we are attempting to obtain the last known normal, patient seemed to improve.  He was able to answer more questions although simple questions.  He does not seem to have any slurred speech.  He is moving that left side although does not follow commands.  He also seems to be able to look to the left as well as the right.  At this point since there is noes evidence currently of an LVO and it was determined that his last known normal was about 12 hours ago, code stroke was not activated.  CT of his head shows a large area of encephalomalacia in the right frontal lobe.  CT with cervical spine shows no acute abnormality.  His labs show mild elevation in his creatinine and his glucose.  Otherwise his labs are nonconcerning.  He has not had any further seizure activity in the ED.  He does seem to have some ongoing left side weakness.  He is able to answer questions but is not super cooperative.  It is unclear if this left-sided weakness is old or new.  I spoke with Dr. Darlyn Read with neurology.  She felt based on the head CT findings that the left side weakness may be chronic in nature.  He was loaded with Keppra.  He has not had any further seizure activity.  I spoke with the resident with the internal medicine teaching service to admit the patient for further treatment. Final  Clinical Impression(s) / ED Diagnoses Final diagnoses:  Seizure [facility])    Rx / DC Orders ED Discharge Orders     None        Rolan Bucco, MD 08/10/20 1053

## 2020-08-10 NOTE — ED Triage Notes (Signed)
BIB [facility] after pt family called to report pt having seizures. Per EMS family walked into pt mid seizure with LOC and muscle contracting. EMS also witnessed two additional tonic-clonic seizures en route and 2.5 mg Midazolam was given.    HX: alcohol abuse and seizures related to alcohol withdrawals
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
## 2020-08-11 ENCOUNTER — Other Ambulatory Visit (HOSPITAL_COMMUNITY): Payer: Self-pay

## 2020-08-11 DIAGNOSIS — N179 Acute kidney failure, unspecified: Secondary | ICD-10-CM | POA: Diagnosis not present

## 2020-08-11 DIAGNOSIS — Z720 Tobacco use: Secondary | ICD-10-CM | POA: Diagnosis not present

## 2020-08-11 DIAGNOSIS — Z7289 Other problems related to lifestyle: Secondary | ICD-10-CM | POA: Diagnosis not present

## 2020-08-11 DIAGNOSIS — R7401 Elevation of levels of liver transaminase levels: Secondary | ICD-10-CM | POA: Diagnosis not present

## 2020-08-11 LAB — TSH: TSH: 1.332 u[IU]/mL (ref 0.350–4.500)

## 2020-08-11 LAB — CBC
HCT: 38.2 % — ABNORMAL LOW (ref 39.0–52.0)
Hemoglobin: 13.7 g/dL (ref 13.0–17.0)
MCH: 34 pg (ref 26.0–34.0)
MCHC: 35.9 g/dL (ref 30.0–36.0)
MCV: 94.8 fL (ref 80.0–100.0)
Platelets: 201 10*3/uL (ref 150–400)
RBC: 4.03 MIL/uL — ABNORMAL LOW (ref 4.22–5.81)
RDW: 14.5 % (ref 11.5–15.5)
WBC: 8.8 10*3/uL (ref 4.0–10.5)
nRBC: 0 % (ref 0.0–0.2)

## 2020-08-11 LAB — COMPREHENSIVE METABOLIC PANEL
ALT: 49 U/L — ABNORMAL HIGH (ref 0–44)
AST: 55 U/L — ABNORMAL HIGH (ref 15–41)
Albumin: 3.2 g/dL — ABNORMAL LOW (ref 3.5–5.0)
Alkaline Phosphatase: 74 U/L (ref 38–126)
Anion gap: 7 (ref 5–15)
BUN: 10 mg/dL (ref 8–23)
CO2: 24 mmol/L (ref 22–32)
Calcium: 8.9 mg/dL (ref 8.9–10.3)
Chloride: 106 mmol/L (ref 98–111)
Creatinine, Ser: 1.24 mg/dL (ref 0.61–1.24)
GFR, Estimated: >60 mL/min (ref >60)
Glucose, Bld: 101 mg/dL — ABNORMAL HIGH (ref 70–99)
Potassium: 3.6 mmol/L (ref 3.5–5.1)
Sodium: 137 mmol/L (ref 135–145)
Total Bilirubin: 0.4 mg/dL (ref 0.3–1.2)
Total Protein: 6.4 g/dL — ABNORMAL LOW (ref 6.5–8.1)

## 2020-08-11 LAB — HEPATITIS PANEL, ACUTE
HCV Ab: NONREACTIVE
Hep A IgM: NONREACTIVE
Hep B C IgM: NONREACTIVE
Hepatitis B Surface Ag: NONREACTIVE

## 2020-08-11 LAB — HIV ANTIBODY (ROUTINE TESTING W REFLEX): HIV Screen 4th Generation wRfx: NONREACTIVE

## 2020-08-11 LAB — VITAMIN B12: Vitamin B-12: 186 pg/mL (ref 180–914)

## 2020-08-11 LAB — FOLATE: Folate: 12.8 ng/mL (ref >5.9)

## 2020-08-11 LAB — CK: Total CK: 2191 U/L — ABNORMAL HIGH (ref 49–397)

## 2020-08-11 LAB — AMMONIA: Ammonia: 35 umol/L (ref 9–35)

## 2020-08-11 MED ORDER — LEVETIRACETAM 500 MG PO TABS
500.0000 mg | ORAL_TABLET | Freq: Two times a day (BID) | ORAL | 0 refills | Status: DC
  Filled 2020-08-11: qty 60, 30d supply, fill #0

## 2020-08-11 MED ORDER — FOLIC ACID 1 MG PO TABS
1.0000 mg | ORAL_TABLET | Freq: Every day | ORAL | 0 refills | Status: AC
  Filled 2020-08-11: qty 30, 30d supply, fill #0

## 2020-08-11 MED ORDER — LIDOCAINE VISCOUS HCL 2 % MT SOLN
5.0000 mL | OROMUCOSAL | 0 refills | Status: AC | PRN
  Filled 2020-08-11: qty 100, 4d supply, fill #0

## 2020-08-11 MED ORDER — THIAMINE HCL 100 MG PO TABS
100.0000 mg | ORAL_TABLET | Freq: Every day | ORAL | 0 refills | Status: AC
  Filled 2020-08-11: qty 30, 30d supply, fill #0

## 2020-08-11 NOTE — Hospital Course (Signed)
Reports feeling "alright" and is trying to get his strength back. Reports it's doing okay. Endorses mouth pain secondary to tongue laceration.

## 2020-08-11 NOTE — TOC Transition Note (Signed)
Transition of Care [facility]) - CM/SW Discharge Note   Patient Details  Name: Brad Jenkins MRN: [id number] Date of Birth: 1955-09-13  [facility] CM/SW Contact:  Kermit Balo, RN Phone Number: 08/11/2020, 12:30 PM   Clinical Narrative:    Patient is discharging home with self care. No f/u per PT/OT. Pt requesting a shower bench for home. CM has ordered through [employer] and it will be delivered to the room. Lucila Maine is at the bedside and will assist with medications at home.  Pt and grandson deny any transportation issues. Pt is also going to get Medicaid transportation arranged.  Daughter to provide transport home today.   Final next level of care: Home/Self Care Barriers to Discharge: No Barriers Identified   Patient Goals and CMS Choice     Choice offered to / list presented to : Patient, Adult Children  Discharge Placement                       Discharge Plan and Services                DME Arranged: Tub bench DME Agency: [employer] Date DME Agency Contacted: 08/11/20   Representative spoke with at DME Agency: Velna Hatchet            Social Determinants of Health (SDOH) Interventions     Readmission Risk Interventions No flowsheet data found.

## 2020-08-11 NOTE — Progress Notes (Signed)
Occupational Therapy Evaluation Patient Details Name: Brad Jenkins MRN: [id number] DOB: 1955/02/09 Today's Date: 08/11/2020    History of Present Illness Pt is a 65 y.o. male who presented 08/10/20 with seizure-like activity. CT Head without acute intracranial abnormality but did show extensive chronic encephalomalacia in bil frontal lobe (R>L) with small region in left parafalcine frontal lobe. 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 Pt states someone can be with him at DC. Pt most likely close to baseline level of function. Recommend pt ambulate with staff. No further OT needs.     Follow Up Recommendations  No OT follow up (S intially with ADL and mobility); S with all medication management   Equipment Recommendations  None recommended by OT    Recommendations for Other Services       Precautions / Restrictions Precautions Precautions: Fall Precaution Comments: seizures      Mobility Bed Mobility Overal bed mobility: Modified Independent             General bed mobility comments: Pt able to transition supine > sit EOB with HOB elevated safely.    Transfers Overall transfer level: Needs assistance Equipment used: None Transfers: Sit to/from Stand Sit to Stand: Supervision              Balance Overall balance assessment: Mild deficits observed, not formally tested                                   Dynamic Gait Index Steps:  (assumed, not tested)     ADL either performed or assessed with clinical judgement   ADL Overall ADL's : Needs assistance/impaired                                     Functional mobility during ADLs: Supervision/safety General ADL Comments: S with bathing/dressing     Vision Patient Visual Report: Blurring of vision Additional Comments: Is suppose to wear the glasses he got "when I was in prison" but doesn't. No  new complaints of blurry vision     Perception     Praxis      Pertinent Vitals/Pain Pain Assessment: Faces Faces Pain Scale: Hurts little more Pain Location: tongue Pain Descriptors / Indicators: Discomfort Pain Intervention(s): Limited activity within patient's tolerance     Hand Dominance     Extremity/Trunk Assessment Upper Extremity Assessment Upper Extremity Assessment: Overall WFL for tasks assessed   Lower Extremity Assessment Lower Extremity Assessment: Defer to PT evaluation   Cervical / Trunk Assessment Cervical / Trunk Assessment: Normal   Communication Communication Communication: No difficulties   Cognition Arousal/Alertness: Awake/alert Behavior During Therapy: Flat affect Overall Cognitive Status: No family/caregiver present to determine baseline cognitive functioning                                 General Comments: most likely close to baseline; appropriate conversation during session; able to recall events adn use anticiapatory awrenss stating he probably needs to quit smoking and will likely have to take seizure medication   General Comments  lacerated tongue form biting it during his seizure    Exercises     Shoulder Instructions  Home Living Family/patient expects to be discharged to:: Private residence Living Arrangements: Children;Other relatives (adult daughter and adult grandson) Available Help at Discharge: Family;Friend(s);Available 24 hours/day;Neighbor Type of Home: Apartment Home Access: Stairs to enter Entrance Stairs-Number of Steps: 13 Entrance Stairs-Rails: Right (ascending) Home Layout: Two level;1/2 bath on main level (bedroom main level, full bath 2nd floor) Alternate Level Stairs-Number of Steps: 13 Alternate Level Stairs-Rails: Right;Left (bil half-way then only R side ascending final half) Bathroom Shower/Tub: Chief Strategy Officer: Standard Bathroom Accessibility: Yes How Accessible:  Accessible via walker Home Equipment: Walker - 4 wheels;Bedside commode;Shower seat          Prior Functioning/Environment Level of Independence: Independent        Comments: likes to watch [employer]        OT Problem List: Decreased safety awareness;Decreased cognition;Impaired balance (sitting and/or standing);Pain      OT Treatment/Interventions:      OT Goals(Current goals can be found in the care plan section) Acute Rehab OT Goals Patient Stated Goal: to go home OT Goal Formulation: All assessment and education complete, DC therapy  OT Frequency:     Barriers to D/C:            Co-evaluation              AM-PAC OT "6 Clicks" Daily Activity     Outcome Measure Help from another person eating meals?: None Help from another person taking care of personal grooming?: None Help from another person toileting, which includes using toliet, bedpan, or urinal?: A Little Help from another person bathing (including washing, rinsing, drying)?: A Little Help from another person to put on and taking off regular upper body clothing?: A Little Help from another person to put on and taking off regular lower body clothing?: A Little 6 Click Score: 20   End of Session Equipment Utilized During Treatment: Gait belt Nurse Communication: Mobility status  Activity Tolerance: Patient tolerated treatment well Patient left: in bed;with call bell/phone within reach;with bed alarm set  OT Visit Diagnosis: Unsteadiness on feet (R26.81);Other symptoms and signs involving cognitive function;Pain Pain - part of body:  (tongue)                Time: 4098-1191 OT Time Calculation (min): 14 min Charges:  OT General Charges $OT Visit: 1 Visit OT Evaluation $OT Eval Low Complexity: 1 Low  Alok Minshall, OT/L   Acute OT Clinical Specialist Acute Rehabilitation Services Pager [phone] Office [phone]   [facility] 08/11/2020, 10:29 AM

## 2020-08-11 NOTE — Discharge Summary (Signed)
Name: Brad Jenkins MRN: [id number] DOB: January 05, 1956 65 y.o. PCP: Default, Provider, MD  Date of Admission: 08/10/2020  8:46 AM Date of Discharge: 08/11/2020 Attending Physician: Gust Rung, DO  Discharge Diagnosis: 1. Seizure 2. AKI 3. Alcohol use disorder 4. Transaminitis 5. Tobacco use disorder   Discharge Medications: Allergies as of 08/11/2020   No Known Allergies      Medication List     STOP taking these medications    BC Fast Pain Relief 650-195-33.3 MG Pack Generic drug: Aspirin-Salicylamide-Caffeine   naproxen sodium 220 MG tablet Commonly known as: ALEVE       TAKE these medications    folic acid 1 MG tablet Commonly known as: FOLVITE Take 1 tablet (1 mg total) by mouth daily. Start taking on: August 12, 2020   levETIRAcetam 500 MG tablet Commonly known as: Keppra Take 1 tablet (500 mg total) by mouth 2 (two) times daily.   lidocaine 2 % solution Commonly known as: XYLOCAINE Use as directed 5 mLs in the mouth or throat every 4 (four) hours as needed for up to 5 days for mouth pain.   thiamine 100 MG tablet Take 1 tablet (100 mg total) by mouth daily.        Disposition and follow-up:   Brad Jenkins was discharged from [facility] in Stable condition.  At the hospital follow up visit please address:  1.  Seizures: In setting of traumatic brain injury resulting in cortical encephalomalacia. Patient started on Keppra 500mg  twice daily. Please ensure he is taking this. Please re-inforce seizure precautions. Patient to follow up with neurology, please make sure he has plans to do so.   AKI: baseline sCr 1.14 in 2020; elevated to 1.64 in setting of rhabdomyolysis and recent NSAID use; sCr trending down on discharge. F/u CMP. Advise against NSAID use for tooth pain  Alcohol use disorder: currently drinks 3-4 beers per day; encourage cessation  Transaminitis: RUQ 2021 wnl and hepatitis panel negative; likely 2/2  AUD vs recent seizure activity; f/u CMP.   Tobacco use disorder: Encourage cessation.  2.  Labs / imaging needed at time of follow-up: CMP  3.  Pending labs/ test needing follow-up: MMA  Follow-up Appointments:  Follow-up Information     [facility]. Go on 08/17/2020.   Why: at 2:45PM for hospital follow up Contact information: [address] [place] [phone]        [facility]. Schedule an appointment as soon as possible for a visit in 2 week(s).   Contact information: [address]     [address] [place] [phone]               Hospital Course by problem list: 1. Seizures Patient presented with multiple witnessed episodes of generalized tonic-clonic seizures. He reports history of seizures secondary to a traumatic brain injury. Work up, including CT Head and MRI Brain significant for extensive chronic encephalomalacia in the right frontal lobe. Patient loaded with Keppra and continued on IV Keppra during admission. No further episodes of seizure-like activity noted. Neurology consulted and recommend continuation of Keppra on discharge with neurology follow up as outpatient. Can consider rEEG with neurology as outpatient. Seizure precautions on discharge discussed with patient and family.   2. AKI Noted to have elevated sCr to 1.67 on admission (baseline sCr 1.14 in 08/2018). Patient endorsed recent NSAID use for a tooth ache. UA with moderate hemoglobinuria  and some proteinuria but no bacteruria or pyuria noted. No urinary symptoms. CK also elevated to 2,191 in setting of recent seizure-like activity. Renal US without hydronephrosis. FeNa 0.9% suggestive of pre-renal etiology. Patient given 1L LR bolus with improvement of sCr to 1.24.   3. Transaminitis Patient noted to have mild elevation in AST and ALT on admission. RUQ US obtained without any hepatobiliary  abnormalities noted. Acute hepatitis panel negative. Suspect this to be in setting of alcohol use with recent seizure-like activity. Will need CMP at follow up.   4. Alcohol use disorder Patient endorses drinking 3-4x beers daily "for a while" as these help decrease the frequency of his seizures. Did not appear to be in acute alcohol withdrawal on presentation. Patient only received 1mg  of Ativan with CIWA protocol during admission. Discussed with patient that alcohol decreases his seizure threshold and encouraged for cessation. Patient discharged on thiamine 100mg  daily.   5. Tobacco use disorder Smokes 1/2ppd for past 39 years. Patient encouraged for tobacco cessation. He was encouraged to use nicotine patch.    Discharge Exam:   BP (!) 153/88 (BP Location: Right Arm)   Pulse 76   Temp 99 F (37.2 C) (Oral)   Resp 14   Ht 6\' 1"  (1.854 m)   Wt 78.4 kg   SpO2 94%   BMI 22.80 kg/m  Discharge exam:  Physical Exam  Constitutional: Appears well-developed and well-nourished. No distress.  HENT: Normocephalic and atraumatic, EOMI, conjunctiva normal, moist mucous membranes, laceration on tongue, healing Cardiovascular: Normal rate, regular rhythm, S1 and S2 present, no murmurs, rubs, gallops.  Distal pulses intact Respiratory: No respiratory distress, no accessory muscle use.  Effort is normal.  Lungs are clear to auscultation bilaterally. GI: Nondistended, soft, nontender to palpation, normal active bowel sounds Musculoskeletal: Normal bulk and tone.  No peripheral edema noted. Neurological: Is alert and oriented x4, no apparent focal deficits noted. Skin: Warm and dry.  No rash, erythema, lesions noted. Psychiatric: Normal mood and affect. Behavior is normal. Judgment and thought content normal.    Pertinent Labs, Studies, and Procedures:  CBC Latest Ref Rng & Units 08/10/2020 08/10/2020 08/10/2020  WBC 4.0 - 10.5 K/uL 8.8 - 13.2(H)  Hemoglobin 13.0 - 17.0 g/dL 08/12/2020 08/12/2020 08/12/2020   Hematocrit 39.0 - 52.0 % 38.2(L) 45.0 43.9  Platelets 150 - 400 K/uL 201 - 226   CMP Latest Ref Rng & Units 08/10/2020 08/10/2020 08/10/2020  Glucose 70 - 99 mg/dL 08/12/2020) 08/12/2020) 08/12/2020)  BUN 8 - 23 mg/dL 10 10 9   Creatinine 0.61 - 1.24 mg/dL 967(E 938(B) 017(P)  Sodium 135 - 145 mmol/L 137 137 136  Potassium 3.5 - 5.1 mmol/L 3.6 4.2 4.2  Chloride 98 - 111 mmol/L 106 105 100  CO2 22 - 32 mmol/L 24 - 20(L)  Calcium 8.9 - 10.3 mg/dL 8.9 - 9.5  Total Protein 6.5 - 8.1 g/dL 6.4(L) - 7.5  Total Bilirubin 0.3 - 1.2 mg/dL 0.4 - 0.7  Alkaline Phos 38 - 126 U/L 74 - 79  AST 15 - 41 U/L 55(H) - 49(H)  ALT 0 - 44 U/L 49(H) - 55(H)   Urinalysis    Component Value Date/Time   COLORURINE YELLOW 08/10/2020 1200   APPEARANCEUR CLEAR 08/10/2020 1200   LABSPEC 1.015 08/10/2020 1200   PHURINE 5.0 08/10/2020 1200   GLUCOSEU NEGATIVE 08/10/2020 1200   HGBUR MODERATE (A) 08/10/2020 1200   BILIRUBINUR NEGATIVE 08/10/2020 1200   KETONESUR NEGATIVE 08/10/2020 1200   PROTEINUR 100 (A)  08/10/2020 1200   NITRITE NEGATIVE 08/10/2020 1200   LEUKOCYTESUR NEGATIVE 08/10/2020 1200    Drugs of Abuse     Component Value Date/Time   LABOPIA NONE DETECTED 08/10/2020 1200   COCAINSCRNUR NONE DETECTED 08/10/2020 1200   LABBENZ POSITIVE (A) 08/10/2020 1200   AMPHETMU NONE DETECTED 08/10/2020 1200   THCU NONE DETECTED 08/10/2020 1200   LABBARB NONE DETECTED 08/10/2020 1200   Troponin I (High Sensitivity) <18 ng/L 4    Total CK 49 - 397 U/L 2,191 High     Hepatitis B Surface Ag NON REACTIVE NON REACTIVE   HCV Ab NON REACTIVE NON REACTIVE   Comment: (NOTE)  Nonreactive HCV antibody screen is consistent with no HCV infections,  unless recent infection is suspected or other evidence exists to  indicate HCV infection.    Hep A IgM NON REACTIVE NON REACTIVE   Hep B C IgM NON REACTIVE NON REACTIVE   Comment: Performed at [facility], [address] [place], [place] [address]   Folate >5.9 ng/mL 12.8     Vitamin B-12 180 - 914 pg/mL 186    TSH 0.350 - 4.500 uIU/mL 1.332    CT HEAD AND CERVICAL SPINE WO CONTRAST 08/10/20:  IMPRESSION: 1. No acute intracranial abnormality. Atrophy with chronic small vessel white matter ischemic disease. 2. Extensive chronic encephalomalacia in the right frontal lobe with small region of encephalomalacia noted left para falcine frontal lobe. 3. No cervical spine fracture. 4. Loss of cervical lordosis. This can be related to patient positioning, muscle spasm or soft tissue injury.  COMPLETE ABDOMINAL US 08/10/20:  Liver: No focal lesion identified. Within normal limits in parenchymal echogenicity. Portal vein is patent on color Doppler imaging with normal direction of blood flow towards the liver. Right Kidney: Length: 10.9 cm. Echogenicity within normal limits. No mass or hydronephrosis visualized. Left Kidney: Length: 10.6 cm. Echogenicity within normal limits. No mass or hydronephrosis visualized.  MRI BRAIN WO CONTRAST 08/10/20:  IMPRESSION: 1. No acute intracranial abnormality. 2. Right greater than left frontal lobe encephalomalacia which could be posttraumatic or ischemic. 3. Moderate chronic small vessel ischemic disease.  Discharge Instructions: Discharge Instructions     Call MD for:  difficulty breathing, headache or visual disturbances   Complete by: As directed    Call MD for:  extreme fatigue   Complete by: As directed    Call MD for:  hives   Complete by: As directed    Call MD for:  persistant dizziness or light-headedness   Complete by: As directed    Call MD for:  persistant nausea and vomiting   Complete by: As directed    Call MD for:  temperature >100.4   Complete by: As directed    Diet - low sodium heart healthy   Complete by: As directed    Driving Restrictions   Complete by: As directed    No driving for 6 months   Increase activity slowly   Complete by: As directed    Other Restrictions   Complete by: As  directed    Seizure precautions: Per [facility] statutes, patients with seizures are not allowed to drive until they have been seizure-free for six months and cleared by a physician   Use caution when using heavy equipment or power tools. Avoid working on ladders or at heights. Take showers instead of baths. Ensure the water temperature is not too high on the home water heater. Do not go swimming alone. Do  not lock yourself in a room alone (i.e. bathroom). When caring for infants or small children, sit down when holding, feeding, or changing them to minimize risk of injury to the child in the event you have a seizure. Maintain good sleep hygiene. Avoid alcohol.       Signed: Eliezer BottomAslam, Wendelin Bradt, MD IMTS PGY-3 08/11/2020, 11:25 AM   Pager: [phone]

## 2020-08-11 NOTE — TOC CAGE-AID Note (Signed)
Transition of Care [facility]) - CAGE-AID Screening   Patient Details  Name: Brad Jenkins MRN: [id number] Date of Birth: 07/18/1955  Transition of Care [facility]) CM/SW Contact:    Kermit Balo, RN Phone Number: 08/11/2020, 12:22 PM   Clinical Narrative: Patient admits to drinking alcohol. He accepted resources for inpatient/ outpatient counseling.   CAGE-AID Screening:    Have You Ever Felt You Ought to Cut Down on Your Drinking or Drug Use?: Yes Have People Annoyed You By Critizing Your Drinking Or Drug Use?: Yes Have You Felt Bad Or Guilty About Your Drinking Or Drug Use?: Yes Have You Ever Had a Drink or Used Drugs First Thing In The Morning to Steady Your Nerves or to Get Rid of a Hangover?: Yes CAGE-AID Score: 4  Substance Abuse Education Offered: Yes  Substance abuse interventions: Patient Counseling, Transport planner

## 2020-08-11 NOTE — Plan of Care (Signed)

## 2020-08-11 NOTE — Progress Notes (Signed)
Pt wheeled off unit by this RN with his grandson and all of his belongings  by his side. IV removed and CCMD notified of pts discharge.

## 2020-08-15 LAB — METHYLMALONIC ACID, SERUM: Methylmalonic Acid, Quantitative: 198 nmol/L (ref 0–378)

## 2020-08-17 ENCOUNTER — Encounter: Payer: Self-pay | Admitting: Internal Medicine

## 2020-08-17 ENCOUNTER — Ambulatory Visit (INDEPENDENT_AMBULATORY_CARE_PROVIDER_SITE_OTHER): Payer: Medicaid Other | Admitting: Internal Medicine

## 2020-08-17 ENCOUNTER — Other Ambulatory Visit: Payer: Self-pay

## 2020-08-17 VITALS — BP 137/80 | HR 70 | Temp 98.0°F | Ht 73.0 in | Wt 164.3 lb

## 2020-08-17 DIAGNOSIS — M25512 Pain in left shoulder: Secondary | ICD-10-CM

## 2020-08-17 DIAGNOSIS — F32A Depression, unspecified: Secondary | ICD-10-CM | POA: Diagnosis not present

## 2020-08-17 DIAGNOSIS — Z720 Tobacco use: Secondary | ICD-10-CM

## 2020-08-17 DIAGNOSIS — R569 Unspecified convulsions: Secondary | ICD-10-CM

## 2020-08-17 DIAGNOSIS — N179 Acute kidney failure, unspecified: Secondary | ICD-10-CM

## 2020-08-17 MED ORDER — NICOTINE POLACRILEX 4 MG MT LOZG: 4.0000 mg | LOZENGE | OROMUCOSAL | 0 refills | Status: DC | PRN

## 2020-08-17 MED ORDER — DICLOFENAC SODIUM 1 % EX GEL: 4.0000 g | Freq: Four times a day (QID) | CUTANEOUS | 0 refills | Status: DC

## 2020-08-17 MED ORDER — SERTRALINE HCL 25 MG PO TABS: 25.0000 mg | ORAL_TABLET | Freq: Every day | ORAL | 0 refills | Status: DC

## 2020-08-17 NOTE — Assessment & Plan Note (Signed)
Mr. Lemoine reports left shoulder pain that began after his seizure episode on 08/10/20. He was found by his daughter lying on his left side convulsing (lasted 25-30 seconds). He states the pain is localized to his deltoid muscle. On physical exam, tenderness on palpation of the deltoid muscle. He has full ROM with minimal pain. Low suspicion for bone fracture.  PLAN: Advised to take Tylenol for pain relief. Prescribed Voltaren gel for pain relief.  Advised to call the office if symptoms worsen

## 2020-08-17 NOTE — Progress Notes (Signed)
CC: HFU - seizure  HPI:  [name] is a 65 y.o. male with a past medical history stated below and presents today for rencent seizure. Brad Jenkins suffered a seizure on 08/10/2020.  His daughter witnessed the generalized convulsions which lasted about 20-30 seconds.  He was found in the bathroom lying on his left side.  En route to the ED he also experienced another episode of generalized convulsions (lasted 30 seconds) and he was given midazolam which terminated the seizure.  He was discharged from the ED same day and was prescribed Keppra 500 mg twice daily.  Today Brad Jenkins was accompanied by his daughter.  He states he has not experienced another seizure episode since discharge.  He denies any headaches, changes in vision, chest pain, shortness of breath, N/V, diarrhea or constipation, LL swelling or cramping.  He reports left shoulder pain that began following discharge from the hospital.  He states the pain is localized around his deltoid muscle.  Denies any numbness and tingling of the left arm.  Please see problem based assessment and plan for additional details.  No past medical history on file.  Current Outpatient Medications on File Prior to Visit  Medication Sig Dispense Refill   folic acid (FOLVITE) 1 MG tablet Take 1 tablet (1 mg total) by mouth daily. 30 tablet 0   levETIRAcetam (KEPPRA) 500 MG tablet Take 1 tablet (500 mg total) by mouth 2 (two) times daily. 60 tablet 0   thiamine 100 MG tablet Take 1 tablet (100 mg total) by mouth daily. 30 tablet 0   No current facility-administered medications on file prior to visit.    No family history on file.  Social History   Socioeconomic History   Marital status: Legally Separated    Spouse name: Not on file   Number of children: Not on file   Years of education: Not on file   Highest education level: Not on file  Occupational History   Not on file  Tobacco Use   Smoking status: Every Day    Packs/day: 1.00     Types: Cigarettes   Smokeless tobacco: Never  Substance and Sexual Activity   Alcohol use: Yes   Drug use: Not Currently   Sexual activity: Not on file  Other Topics Concern   Not on file  Social History Narrative   Not on file   Social Determinants of Health   Financial Resource Strain: Not on file  Food Insecurity: Not on file  Transportation Needs: Not on file  Physical Activity: Not on file  Stress: Not on file  Social Connections: Not on file  Intimate Partner Violence: Not on file    Review of Systems  Constitutional:  Positive for chills. Negative for fever.  Eyes:  Negative for blurred vision and double vision.  Respiratory:  Negative for shortness of breath and wheezing.   Cardiovascular:  Negative for chest pain, palpitations and leg swelling.  Gastrointestinal:  Negative for abdominal pain, constipation, diarrhea, heartburn, nausea and vomiting.  Musculoskeletal:  Positive for joint pain (left shoulder).  Neurological:  Negative for dizziness and headaches.  Psychiatric/Behavioral:  Positive for depression. Negative for suicidal ideas.     Vitals:   08/17/20 1457 08/17/20 1507  BP: (!) 144/74 137/80  Pulse: 74 70  Temp: 98 F (36.7 C)   TempSrc: Oral   SpO2: 99%   Weight: 164 lb 4.8 oz (74.5 kg)   Height: 6\' 1"  (1.854 m)  Physical Exam Constitutional:      Appearance: Normal appearance.  HENT:     Head: Normocephalic and atraumatic.  Eyes:     General: Lids are normal.  Cardiovascular:     Rate and Rhythm: Normal rate and regular rhythm.     Pulses:          Radial pulses are 2+ on the right side and 2+ on the left side.     Heart sounds: Normal heart sounds, S1 normal and S2 normal.  Pulmonary:     Effort: Pulmonary effort is normal.     Breath sounds: Normal breath sounds and air entry.  Abdominal:     General: Abdomen is protuberant. Bowel sounds are normal.     Tenderness: There is no abdominal tenderness.  Musculoskeletal:     Left  shoulder: Tenderness present. No swelling, deformity, effusion, laceration or bony tenderness. Normal range of motion. Normal strength.     Right lower leg: No edema.     Left lower leg: No edema.  Skin:    General: Skin is warm and dry.  Neurological:     Mental Status: He is alert and oriented to person, place, and time.  Psychiatric:        Attention and Perception: Attention normal.        Mood and Affect: Mood normal.        Speech: Speech normal.        Behavior: Behavior is cooperative.      Assessment & Plan:   See Encounters Tab for problem based charting.  Patient seen with Dr. Marylene Buerger, M.D. [facility] Internal Medicine, PGY-1 Pager: [phone], Phone: [phone] Date 08/17/2020 Time 4:30 PM

## 2020-08-17 NOTE — Assessment & Plan Note (Signed)
Currently smokes 1/ppd. Number of years unknown. He is interested in quitting and is expected to start using nicotine patches that were recently approved by his Medicaid insurance.  PLAN: Prescribed nicotine lozenges to use concomitantly with nicotine patches to reduce cravings.

## 2020-08-17 NOTE — Assessment & Plan Note (Addendum)
Brad Jenkins presented to ED s/p witnessed generalized seizure episode (lasted 25-30 seconds) on 08/10/20. He was given Keppra in the ED. He is currently taking Keppra 500mg  twice daily since discharge from ED. He denies any seizure episode since discharge. Labs in ED  revealed transaminitis (AST-55; ALT-49) and AKI  (Cr. 1.67).   PLAN: Instructed to continue taking medication as prescribed. Counseled extensively on the importance of taking this medication daily.  He was counseled on deceasing alcohol consumption as it can decrease the seizure threshold. Repeat CMP to assess Creatinine levels and LFTs Follow up in 3 months

## 2020-08-17 NOTE — Assessment & Plan Note (Addendum)
Brad Jenkins reports feelings of depressed mood, lack of motivation, lack of interests. He states he feels sad that his siblings don't speak to him as much. He also fustrated that he is sick and states he's been healthy all his life. Adjusting to taking medications and ED visit has him a depressed mood. Denies SI, HI. PHQ-9= 18  PLAN: Started on Sertriline 25mg  daily, titrate up as tolerated Follow up in 3 months

## 2020-08-17 NOTE — Patient Instructions (Signed)
Only take Tylenol for pain  Started on Sertraline for depression, see how you tolerate it  Can take nicotine losange with nicotine patches to curve cravings  Voltaren gel for pain relief

## 2020-08-18 LAB — CMP14 + ANION GAP
ALT: 31 IU/L (ref 0–44)
AST: 28 IU/L (ref 0–40)
Albumin/Globulin Ratio: 1.5 (ref 1.2–2.2)
Albumin: 4.7 g/dL (ref 3.8–4.8)
Alkaline Phosphatase: 104 IU/L (ref 44–121)
Anion Gap: 17 mmol/L (ref 10.0–18.0)
BUN/Creatinine Ratio: 8 — ABNORMAL LOW (ref 10–24)
BUN: 8 mg/dL (ref 8–27)
Bilirubin Total: 0.2 mg/dL (ref 0.0–1.2)
CO2: 23 mmol/L (ref 20–29)
Calcium: 9.7 mg/dL (ref 8.6–10.2)
Chloride: 99 mmol/L (ref 96–106)
Creatinine, Ser: 1.04 mg/dL (ref 0.76–1.27)
Globulin, Total: 3.2 g/dL (ref 1.5–4.5)
Glucose: 79 mg/dL (ref 65–99)
Potassium: 4.6 mmol/L (ref 3.5–5.2)
Sodium: 139 mmol/L (ref 134–144)
Total Protein: 7.9 g/dL (ref 6.0–8.5)
eGFR: 80 mL/min/{1.73_m2} (ref >59)

## 2020-08-30 ENCOUNTER — Encounter: Payer: Medicaid Other | Admitting: Student

## 2020-09-05 ENCOUNTER — Telehealth: Payer: Self-pay | Admitting: *Deleted

## 2020-09-05 NOTE — Telephone Encounter (Signed)
Signed CMN for bath seat with back faxed to Durward Mallard at [facility] at [phone]. Fax confirmation receipt received.

## 2020-10-25 ENCOUNTER — Emergency Department (HOSPITAL_COMMUNITY)
Admission: EM | Admit: 2020-10-25 | Discharge: 2020-10-25 | Disposition: A | Discharge status: Home / Self Care | Payer: Medicaid Other | Attending: Physician Assistant | Admitting: Physician Assistant

## 2020-10-25 ENCOUNTER — Other Ambulatory Visit: Payer: Self-pay

## 2020-10-25 ENCOUNTER — Emergency Department (HOSPITAL_COMMUNITY): Payer: Medicaid Other

## 2020-10-25 DIAGNOSIS — R519 Headache, unspecified: Secondary | ICD-10-CM | POA: Diagnosis not present

## 2020-10-25 DIAGNOSIS — R531 Weakness: Secondary | ICD-10-CM | POA: Insufficient documentation

## 2020-10-25 DIAGNOSIS — M542 Cervicalgia: Secondary | ICD-10-CM | POA: Insufficient documentation

## 2020-10-25 DIAGNOSIS — Z5321 Procedure and treatment not carried out due to patient leaving prior to being seen by health care provider: Secondary | ICD-10-CM | POA: Insufficient documentation

## 2020-10-25 LAB — COMPREHENSIVE METABOLIC PANEL
ALT: 29 U/L (ref 0–44)
AST: 39 U/L (ref 15–41)
Albumin: 4 g/dL (ref 3.5–5.0)
Alkaline Phosphatase: 104 U/L (ref 38–126)
Anion gap: 15 (ref 5–15)
BUN: 8 mg/dL (ref 8–23)
CO2: 19 mmol/L — ABNORMAL LOW (ref 22–32)
Calcium: 8.7 mg/dL — ABNORMAL LOW (ref 8.9–10.3)
Chloride: 99 mmol/L (ref 98–111)
Creatinine, Ser: 1.05 mg/dL (ref 0.61–1.24)
GFR, Estimated: >60 mL/min (ref >60)
Glucose, Bld: 86 mg/dL (ref 70–99)
Potassium: 4.2 mmol/L (ref 3.5–5.1)
Sodium: 133 mmol/L — ABNORMAL LOW (ref 135–145)
Total Bilirubin: 0.4 mg/dL (ref 0.3–1.2)
Total Protein: 7.9 g/dL (ref 6.5–8.1)

## 2020-10-25 LAB — URINALYSIS, ROUTINE W REFLEX MICROSCOPIC
Bilirubin Urine: NEGATIVE
Glucose, UA: NEGATIVE mg/dL
Ketones, ur: NEGATIVE mg/dL
Leukocytes,Ua: NEGATIVE
Nitrite: NEGATIVE
Protein, ur: NEGATIVE mg/dL
Specific Gravity, Urine: 1.003 — ABNORMAL LOW (ref 1.005–1.030)
pH: 5 (ref 5.0–8.0)

## 2020-10-25 LAB — CBC WITH DIFFERENTIAL/PLATELET
Abs Immature Granulocytes: 0.01 10*3/uL (ref 0.00–0.07)
Basophils Absolute: 0 10*3/uL (ref 0.0–0.1)
Basophils Relative: 1 %
Eosinophils Absolute: 0.1 10*3/uL (ref 0.0–0.5)
Eosinophils Relative: 3 %
HCT: 44.5 % (ref 39.0–52.0)
Hemoglobin: 15.3 g/dL (ref 13.0–17.0)
Immature Granulocytes: 0 %
Lymphocytes Relative: 60 %
Lymphs Abs: 3.1 10*3/uL (ref 0.7–4.0)
MCH: 33.2 pg (ref 26.0–34.0)
MCHC: 34.4 g/dL (ref 30.0–36.0)
MCV: 96.5 fL (ref 80.0–100.0)
Monocytes Absolute: 0.4 10*3/uL (ref 0.1–1.0)
Monocytes Relative: 7 %
Neutro Abs: 1.5 10*3/uL — ABNORMAL LOW (ref 1.7–7.7)
Neutrophils Relative %: 29 %
Platelets: 332 10*3/uL (ref 150–400)
RBC: 4.61 MIL/uL (ref 4.22–5.81)
RDW: 13.9 % (ref 11.5–15.5)
WBC: 5.1 10*3/uL (ref 4.0–10.5)
nRBC: 0 % (ref 0.0–0.2)

## 2020-10-25 LAB — RAPID URINE DRUG SCREEN, HOSP PERFORMED
Amphetamines: NOT DETECTED
Barbiturates: NOT DETECTED
Benzodiazepines: NOT DETECTED
Cocaine: NOT DETECTED
Opiates: NOT DETECTED
Tetrahydrocannabinol: NOT DETECTED

## 2020-10-25 LAB — PROTIME-INR
INR: 1 (ref 0.8–1.2)
Prothrombin Time: 13.3 seconds (ref 11.4–15.2)

## 2020-10-25 LAB — TROPONIN I (HIGH SENSITIVITY)
Troponin I (High Sensitivity): 5 ng/L (ref <18)
Troponin I (High Sensitivity): 5 ng/L (ref <18)

## 2020-10-25 LAB — ETHANOL: Alcohol, Ethyl (B): 292 mg/dL — ABNORMAL HIGH (ref <10)

## 2020-10-25 NOTE — ED Notes (Signed)
Patient left on own accord °

## 2020-10-25 NOTE — ED Triage Notes (Signed)
Pt c/o generalized weakness and feeling like his body "drops". Also c/o head and neck pain.

## 2020-10-25 NOTE — ED Provider Notes (Signed)
Emergency Medicine Provider Triage Evaluation Note  Nicole Hafley , a 65 y.o. male  was evaluated in triage.  Pt complains of episodes of weakness, head pain, neck pain.  He states that he has not eaten for a month because he feels like he needs food that will stick on his ribs instead of "[place] food."  He had a episode today where his daughter reports that he was too weak and had difficulty moving both of his arms and legs and she and her son, patient's grandson, had to carry patient out to the car.  Review of Systems  Positive: Neck and back pain, global weakness Negative: Seizures  Physical Exam  BP 132/80 (BP Location: Right Arm)   Pulse 77   Temp 98.4 F (36.9 C) (Oral)   Resp 16   SpO2 95%  Gen:   Awake, no distress   Resp:  Normal effort  MSK:   Moves extremities without difficulty  Other:  Patient is awake and alert.  Answers questions without difficulty.  Medical Decision Making  Medically screening exam initiated at 6:53 PM.  Appropriate orders placed.  Damaris Hippo was informed that the remainder of the evaluation will be completed by another provider, this initial triage assessment does not replace that evaluation, and the importance of remaining in the ED until their evaluation is complete.  Note: Portions of this report may have been transcribed using voice recognition software. Every effort was made to ensure accuracy; however, inadvertent computerized transcription errors may be present    Norman Clay 10/25/20 1854    Wynetta Fines, MD 10/25/20 2306

## 2020-10-27 LAB — LEVETIRACETAM LEVEL: Levetiracetam Lvl: <2 ug/mL — ABNORMAL LOW (ref 10.0–40.0)

## 2020-11-06 ENCOUNTER — Encounter: Payer: Self-pay | Admitting: Internal Medicine

## 2020-11-06 ENCOUNTER — Ambulatory Visit (INDEPENDENT_AMBULATORY_CARE_PROVIDER_SITE_OTHER): Payer: Medicaid Other | Admitting: Internal Medicine

## 2020-11-06 VITALS — BP 146/90 | HR 82 | Temp 98.4°F | Wt 151.7 lb

## 2020-11-06 DIAGNOSIS — F32A Depression, unspecified: Secondary | ICD-10-CM | POA: Diagnosis not present

## 2020-11-06 DIAGNOSIS — R569 Unspecified convulsions: Secondary | ICD-10-CM | POA: Diagnosis not present

## 2020-11-06 DIAGNOSIS — Z720 Tobacco use: Secondary | ICD-10-CM

## 2020-11-06 DIAGNOSIS — M62838 Other muscle spasm: Secondary | ICD-10-CM

## 2020-11-06 DIAGNOSIS — F1020 Alcohol dependence, uncomplicated: Secondary | ICD-10-CM | POA: Diagnosis present

## 2020-11-06 MED ORDER — LEVETIRACETAM 500 MG PO TABS: 500.0000 mg | ORAL_TABLET | Freq: Two times a day (BID) | ORAL | 0 refills | Status: DC

## 2020-11-06 MED ORDER — MIRTAZAPINE 15 MG PO TABS: 15.0000 mg | ORAL_TABLET | Freq: Every day | ORAL | 3 refills | Status: DC

## 2020-11-06 NOTE — Progress Notes (Signed)
Internal Medicine Clinic Attending  I saw and evaluated the patient.  I personally confirmed the key portions of the history and exam documented by Dr. Ariwodo and I reviewed pertinent patient test results.  The assessment, diagnosis, and plan were formulated together and I agree with the documentation in the resident's note.   

## 2020-11-06 NOTE — Assessment & Plan Note (Signed)
Patient states for the last 2 months he has been experiencing right-sided neck stiffness.  Patient denies any injury or trauma to the neck.  Patient states limited range of motion due to tenderness when turning head to the right.  Patient denies pain with flexion or extension of the neck.  No cervical spinous process tenderness on physical exam.  Patient denies numbness or tingling of bilateral upper extremities.  Patient reports "pulling sensation" with right rotation of the neck.   PLAN: Warm/cool compress for pain relief Stretching of the neck encouraged Referral to physical therapy

## 2020-11-06 NOTE — Assessment & Plan Note (Signed)
Patient states that he has been without her medication for the last month.  Patient denies any recurrent seizure episodes since July 2022.  Patient states that a few days ago he fell while walking across the street.  And awoke to a stranger asking him if he is okay.  He did report confusion upon awakening but denies incontinence or tongue biting.  Low suspicion for a seizure episode but could possibly have been.  Patient also has a history of alcohol use disorder, and recent lack of appetite and fluid intake which could have caused lightheadedness resulting in a fall.   PLAN: Refill Keppra 500 mg twice daily (90-day supply, 3 refills) Patient was advised to call office if his medication runs out.

## 2020-11-06 NOTE — Assessment & Plan Note (Signed)
Patient has a history of tobacco use and reports interest in quitting.  Patient was approved for nicotine patches by [employer].  Patient states he was prescribed 1 box of nicotine patches which helped however he no longer has patches.  I prescribed nicotine lozenges and patient has not picked up prescription from pharmacy.   PLAN: Advised to pick up nicotine lozenges at pharmacy Follow-up in 81-month

## 2020-11-06 NOTE — Assessment & Plan Note (Signed)
Patient states that sertraline 25 mg has helped improved his depression.  However, patient states he has been experiencing intermittent diarrhea and erectile dysfunction.  Patient would like to switch to a different medication due to side effect profile. PHQ-9 score 15; improved from previous visit.   PLAN: Taper sertraline 25 mg; advised to take sertraline Monday Wednesday Friday then stop. Started on mirtazapine 15 mg at bedtime Follow-up in 2 months

## 2020-11-06 NOTE — Progress Notes (Signed)
   CC: neck pain; erectile dysfunction  HPI:  [name] is a 65 y.o. male with a past medical history stated below and presents today for cc listed above.   Please see problem based assessment and plan for additional details.  No past medical history on file.  Current Outpatient Medications on File Prior to Visit  Medication Sig Dispense Refill   diclofenac Sodium (VOLTAREN) 1 % GEL Apply 4 g topically 4 (four) times daily. 4 g 0   nicotine polacrilex (NICOTINE MINI) 4 MG lozenge Take 1 lozenge (4 mg total) by mouth as needed for smoking cessation. 100 tablet 0   No current facility-administered medications on file prior to visit.    No family history on file.  Social History   Socioeconomic History   Marital status: Legally Separated    Spouse name: Not on file   Number of children: Not on file   Years of education: Not on file   Highest education level: Not on file  Occupational History   Not on file  Tobacco Use   Smoking status: Every Day    Packs/day: 1.00    Types: Cigarettes   Smokeless tobacco: Never  Substance and Sexual Activity   Alcohol use: Yes   Drug use: Not Currently   Sexual activity: Not on file  Other Topics Concern   Not on file  Social History Narrative   Not on file   Social Determinants of Health   Financial Resource Strain: Not on file  Food Insecurity: Not on file  Transportation Needs: Not on file  Physical Activity: Not on file  Stress: Not on file  Social Connections: Not on file  Intimate Partner Violence: Not on file    Review of Systems: ROS negative except for what is noted on the assessment and plan.  Vitals:   11/06/20 0914  BP: (!) 146/90  Pulse: 82  Temp: 98.4 F (36.9 C)  TempSrc: Oral  SpO2: 98%  Weight: 151 lb 11.2 oz (68.8 kg)     Physical Exam: Constitutional: well-appearing thin main sitting in the chair, in no acute distress HENT: normocephalic atraumatic, mucous membranes moist Eyes:  conjunctiva non-erythematous Neck: supple; no overt abnormalities; tenderness with ROM to the right; no cervical spinous process tenderness; Cardiovascular: regular rate and rhythm, no m/r/g Pulmonary/Chest: normal work of breathing on room air, lungs clear to auscultation bilaterally Abdominal: soft, non-tender, non-distended MSK: normal bulk and tone Neurological: alert & oriented x 3, 5/5 strength in bilateral upper and lower extremities, normal gait Skin: warm and dry Psych: normal behavior   Assessment & Plan:   See Encounters Tab for problem based charting.  Patient seen with Dr. Rivka Spring, M.D. [facility], PGY-1 Pager: [phone], Phone: [phone] Date 11/06/2020 Time 11:30 AM

## 2020-11-06 NOTE — Assessment & Plan Note (Signed)
Patient reports heavy alcohol use; but attempts to cut down.  Patient previously drank 1 pint of hard liquor per day.  Currently patient states that he consumes 3 beers per day.  Daughter who is patient's sole caretaker, who was present during the visit states that she has control over her patient's debit card and controls alcohol purchases.  Patient states that recently if he feels hungry he will drink alcohol instead of excessive alcohol use food.  Patient reports lightheadedness and recent weight loss.  Patient presented to the ED 10/25/2020 complaining of generalized weakness.  Ethanol levels greater than 250 noted on ED labs.  Patient left AMA stating that "he was waiting too long"; Assessment was not complete.  An extensive discussion was had with the patient on the importance of stopping excessive alcohol use.  Patient would like to seek options for stopping on next visit.   PLAN: Check vitamin B12

## 2020-11-06 NOTE — Patient Instructions (Addendum)
Taper down the zoloft- Take one Monday, Wednesday and Friday of this week. Then stop taking it  You will then start this new medication, Mirtazapine 15mg  take at bedtime only. This will help with depression, appetite and it will not cause sexual dysfunction   I refilled your seizure medication, Keppra. Please take this medication twice daily  If you ever tun out of medication, please call the office for a refill  Use warm or cool compress for your neck pain. I also put in a referral for physical therapy.  When you go to the pharmacy for your medications, see if the have the nicotine lozenges because the prescription is still there. If not, give the office a call.

## 2020-11-07 LAB — VITAMIN B12: Vitamin B-12: 263 pg/mL (ref 232–1245)

## 2020-11-14 ENCOUNTER — Other Ambulatory Visit: Payer: Self-pay | Admitting: Student

## 2020-12-06 NOTE — Progress Notes (Signed)
Internal Medicine Clinic Attending  I saw and evaluated the patient.  I personally confirmed the key portions of the history and exam documented by Dr. Ariwodo and I reviewed pertinent patient test results.  The assessment, diagnosis, and plan were formulated together and I agree with the documentation in the resident's note.   

## 2021-01-10 ENCOUNTER — Ambulatory Visit (INDEPENDENT_AMBULATORY_CARE_PROVIDER_SITE_OTHER): Payer: Medicaid Other | Admitting: Internal Medicine

## 2021-01-10 ENCOUNTER — Encounter: Payer: Self-pay | Admitting: Internal Medicine

## 2021-01-10 DIAGNOSIS — Z720 Tobacco use: Secondary | ICD-10-CM | POA: Diagnosis not present

## 2021-01-10 DIAGNOSIS — M62838 Other muscle spasm: Secondary | ICD-10-CM

## 2021-01-10 DIAGNOSIS — F1020 Alcohol dependence, uncomplicated: Secondary | ICD-10-CM | POA: Diagnosis not present

## 2021-01-10 DIAGNOSIS — F32A Depression, unspecified: Secondary | ICD-10-CM | POA: Diagnosis not present

## 2021-01-10 DIAGNOSIS — R569 Unspecified convulsions: Secondary | ICD-10-CM | POA: Diagnosis not present

## 2021-01-10 MED ORDER — MIRTAZAPINE 15 MG PO TABS: 30.0000 mg | ORAL_TABLET | Freq: Every day | ORAL | 3 refills | Status: DC

## 2021-01-10 MED ORDER — NICOTINE POLACRILEX 4 MG MT LOZG: 4.0000 mg | LOZENGE | OROMUCOSAL | 0 refills | Status: AC | PRN

## 2021-01-10 MED ORDER — DICLOFENAC SODIUM 1 % EX GEL: 4.0000 g | Freq: Four times a day (QID) | CUTANEOUS | 0 refills | Status: AC

## 2021-01-10 NOTE — Assessment & Plan Note (Addendum)
Pt happy to report he is drinking significantly less. Pt states he may drink up to 3 beers in a week. Pt no longer consumes hard liquor. Pt's daughter is actively involved in his care and concurs. Pt states he no longer craves or has the urge to drink. Stating that when he used to drink heavily, he would have a drink first thing in the morning. Now, he no longer craves a drink in the morning, in fact he prefers a nice breakfast w/o alcohol. Vitamin b12 checked in October were WNL.   PLAN: Encourage the pt to keep up the good work!

## 2021-01-10 NOTE — Assessment & Plan Note (Signed)
Pt states nicotine patches has helped curve the cravings. Pt down to smoking half pack a day (previously 1 full pack). States that it is more out of "habit" when he lights a cigarette vs craving. Pt endorse increase in appetite since cutting down on smoking. Pt would like to continue nicotine replacement therapy.   PLAN: Continue nicotine replacement therapy- lozenges

## 2021-01-10 NOTE — Patient Instructions (Addendum)
Continue taking all your medications as directed.  I have increased your mirtazapine to 30mg  at bedtime  I will refill the nicotine lozenges, hopefully you'll be able to pick it up at the pharmacy  Follow up in 1 month

## 2021-01-10 NOTE — Assessment & Plan Note (Signed)
Pt endorse muscle spasm of the left arm and leg. Previous OV pt was advised to stretch, use warm compress and a referral for PT was placed. Pt has not resulted to those remedies as of now. However, pt used Voltaren gel on the areas reporting moderate relief. Pt also endorse aleve provides relief. Pt is open to PT. Pt was advised to also try OTC tylenol for relief and to avoid NSAID as it can elevate BP.   PLAN: Referral to PT Continue Voltaren gel; Tylenol for relief

## 2021-01-10 NOTE — Assessment & Plan Note (Signed)
Current regimen: Keppra 500mg  BID; Pt states he is compliant with the medication. Pt haas not had a seizure episode since initiation of medication. Pt denies any syx at this time.   PLAN: Continue Keppra 500mg  BID

## 2021-01-10 NOTE — Assessment & Plan Note (Signed)
Pt was switched to mirtazapine 15mg  daily from sertraline due to side effect of erectile dysfunction. Pt states the mirtazapine has improved his mood and appetite. He states he is able to sleep better, however can not sleep through the night. He can sleep for 3 hours straight before waking up and again.  He states it takes him longer to fall back asleep. Today, pt BP was elevated, suspecting mirtazapine. However, pt admittley smoked 1 cigarret and cigar prior to visit, which can elevate the BP. Pt has no Hx of HTN and never been on antihypertensives. Will increase mirtazapine to 30mg  in hopes pt can sleep better. Close follow up needed to reevaluate BP and monitor weight gain.   PLAN: Increase mirtazapine 15mg  to 30mg  daily at bedtime Follow up in 1 month- BP check and medication tolerance

## 2021-01-10 NOTE — Progress Notes (Signed)
° °  CC: Depression; muscle spasm  HPI:  Brad Jenkins is a 65 y.o. male with a past medical history stated below and presents today for cc listed above. Please see problem based assessment and plan for additional details.  No past medical history on file.  Current Outpatient Medications on File Prior to Visit  Medication Sig Dispense Refill   levETIRAcetam (KEPPRA) 500 MG tablet Take 1 tablet (500 mg total) by mouth 2 (two) times daily. 180 tablet 0   No current facility-administered medications on file prior to visit.    No family history on file.  Social History   Socioeconomic History   Marital status: Legally Separated    Spouse name: Not on file   Number of children: Not on file   Years of education: Not on file   Highest education level: Not on file  Occupational History   Not on file  Tobacco Use   Smoking status: Every Day    Packs/day: 1.00    Types: Cigarettes   Smokeless tobacco: Never  Substance and Sexual Activity   Alcohol use: Yes   Drug use: Not Currently   Sexual activity: Not on file  Other Topics Concern   Not on file  Social History Narrative   Not on file   Social Determinants of Health   Financial Resource Strain: Not on file  Food Insecurity: Not on file  Transportation Needs: Not on file  Physical Activity: Not on file  Stress: Not on file  Social Connections: Not on file  Intimate Partner Violence: Not on file    Review of Systems: ROS negative except for what is noted on the assessment and plan.  Vitals:   01/10/21 1532 01/10/21 1600  BP: (!) 178/86 (!) 161/85  Pulse: 89 80  Temp: 98.4 F (36.9 C)   TempSrc: Oral   SpO2: 99%   Weight: 178 lb 3.2 oz (80.8 kg)      Physical Exam: Constitutional: well-appearing elderly man sitting in the chair, in no acute distress HENT: normocephalic atraumatic, mucous membranes moist Eyes: conjunctiva non-erythematous Neck: supple Cardiovascular: regular rate and rhythm, no  m/r/g Pulmonary/Chest: normal work of breathing on room air, lungs clear to auscultation bilaterally Abdominal: soft, non-tender, non-distended MSK: normal bulk and tone Neurological: alert & oriented x 3, 5/5 strength in bilateral upper and lower extremities, normal gait Skin: warm and dry Psych: normal mood   Assessment & Plan:   See Encounters Tab for problem based charting.  Patient seen with Dr. Sylvester Harder, M.D. [facility] Medicine, PGY-1 Pager: [phone], Phone: [phone] Date 01/10/2021 Time 5:21 PM

## 2021-01-11 NOTE — Progress Notes (Signed)
Internal Medicine Clinic Attending  I saw and evaluated the patient.  I personally confirmed the key portions of the history and exam documented by Dr. Ariwodo and I reviewed pertinent patient test results.  The assessment, diagnosis, and plan were formulated together and I agree with the documentation in the resident's note.   

## 2021-01-16 ENCOUNTER — Other Ambulatory Visit: Payer: Self-pay

## 2021-01-16 MED ORDER — MIRTAZAPINE 15 MG PO TABS
30.0000 mg | ORAL_TABLET | Freq: Every day | ORAL | 3 refills | Status: DC
Start: 2021-01-16 — End: 2021-04-09

## 2021-02-06 ENCOUNTER — Other Ambulatory Visit: Payer: Self-pay

## 2021-02-07 MED ORDER — LEVETIRACETAM 500 MG PO TABS: 500.0000 mg | ORAL_TABLET | Freq: Two times a day (BID) | ORAL | 0 refills | Status: DC

## 2021-04-09 ENCOUNTER — Other Ambulatory Visit: Payer: Self-pay

## 2021-04-09 MED ORDER — MIRTAZAPINE 15 MG PO TABS: 30.0000 mg | ORAL_TABLET | Freq: Every day | ORAL | 3 refills | Status: DC

## 2021-04-10 ENCOUNTER — Other Ambulatory Visit: Payer: Self-pay | Admitting: Internal Medicine

## 2021-04-10 ENCOUNTER — Telehealth: Payer: Self-pay

## 2021-04-10 MED ORDER — MIRTAZAPINE 15 MG PO TBDP: 15.0000 mg | ORAL_TABLET | Freq: Every day | ORAL | 3 refills | Status: AC

## 2021-05-02 ENCOUNTER — Ambulatory Visit
Admission: RE | Admit: 2021-05-02 | Discharge: 2021-05-02 | Disposition: A | Discharge status: Home / Self Care | Payer: Medicaid Other | Source: Ambulatory Visit | Attending: Nurse Practitioner | Admitting: Nurse Practitioner

## 2021-05-02 ENCOUNTER — Other Ambulatory Visit: Payer: Self-pay | Admitting: Nurse Practitioner

## 2021-05-02 DIAGNOSIS — R52 Pain, unspecified: Secondary | ICD-10-CM

## 2021-05-03 ENCOUNTER — Other Ambulatory Visit: Payer: Self-pay

## 2021-05-03 MED ORDER — LEVETIRACETAM 500 MG PO TABS: 500.0000 mg | ORAL_TABLET | Freq: Two times a day (BID) | ORAL | 0 refills | Status: AC

## 2021-10-22 ENCOUNTER — Encounter: Payer: Medicaid Other | Admitting: Internal Medicine

## 2021-10-22 ENCOUNTER — Encounter: Payer: Self-pay | Admitting: Internal Medicine

## 2023-09-27 IMAGING — CR DG ANKLE COMPLETE 3+V*L*
3 series · 3 of 3 positions shown · non-contrast
Comparison: None.

CLINICAL DATA: Chronic left ankle pain without known injury.

EXAM:
LEFT ANKLE COMPLETE - 3+ VIEW

[x ankle ap left]
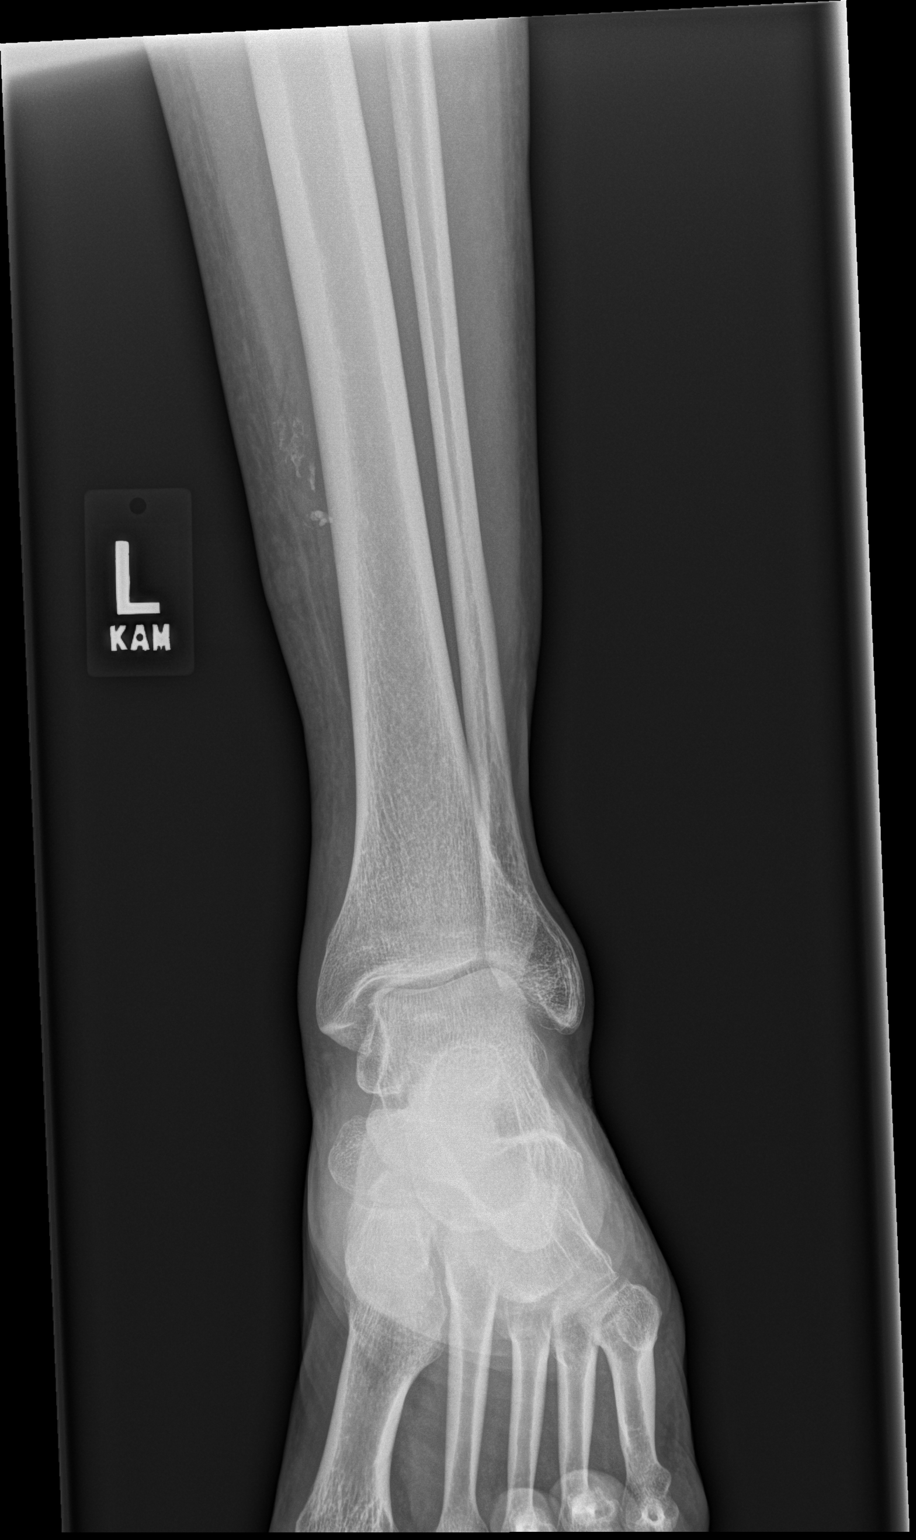

[x ankle obl left]
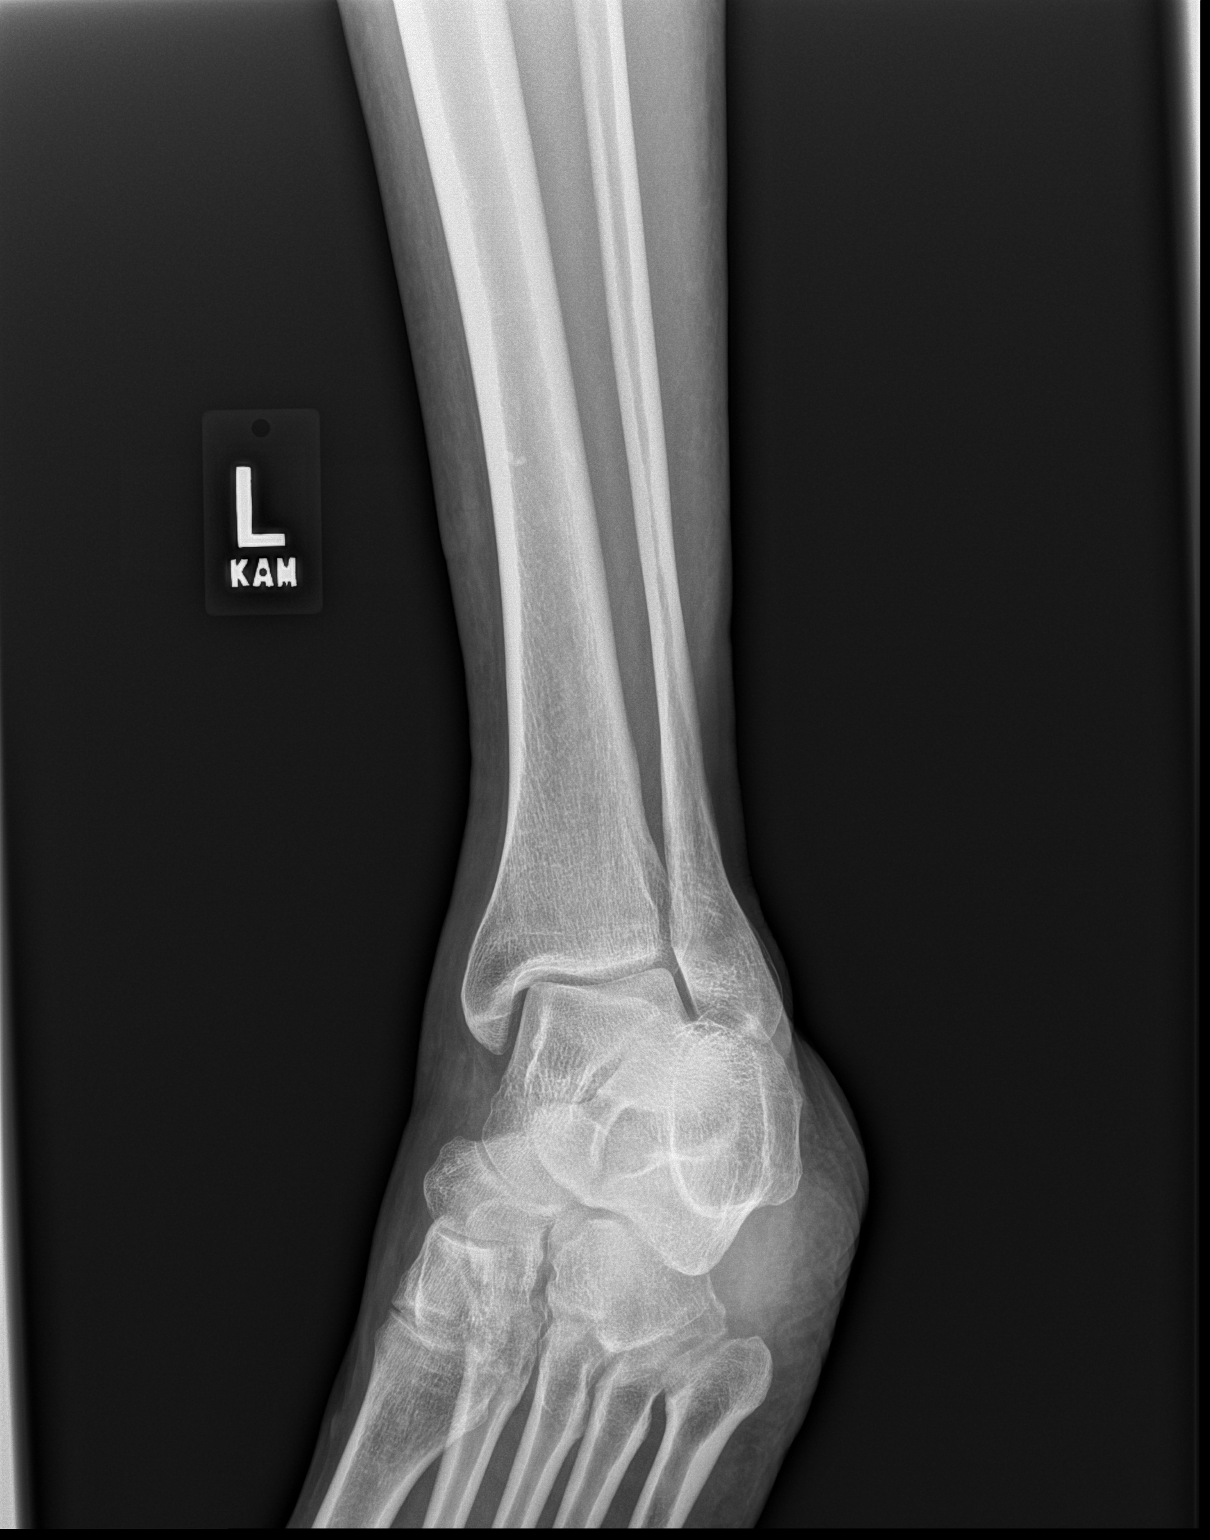

[x ankle lat left]
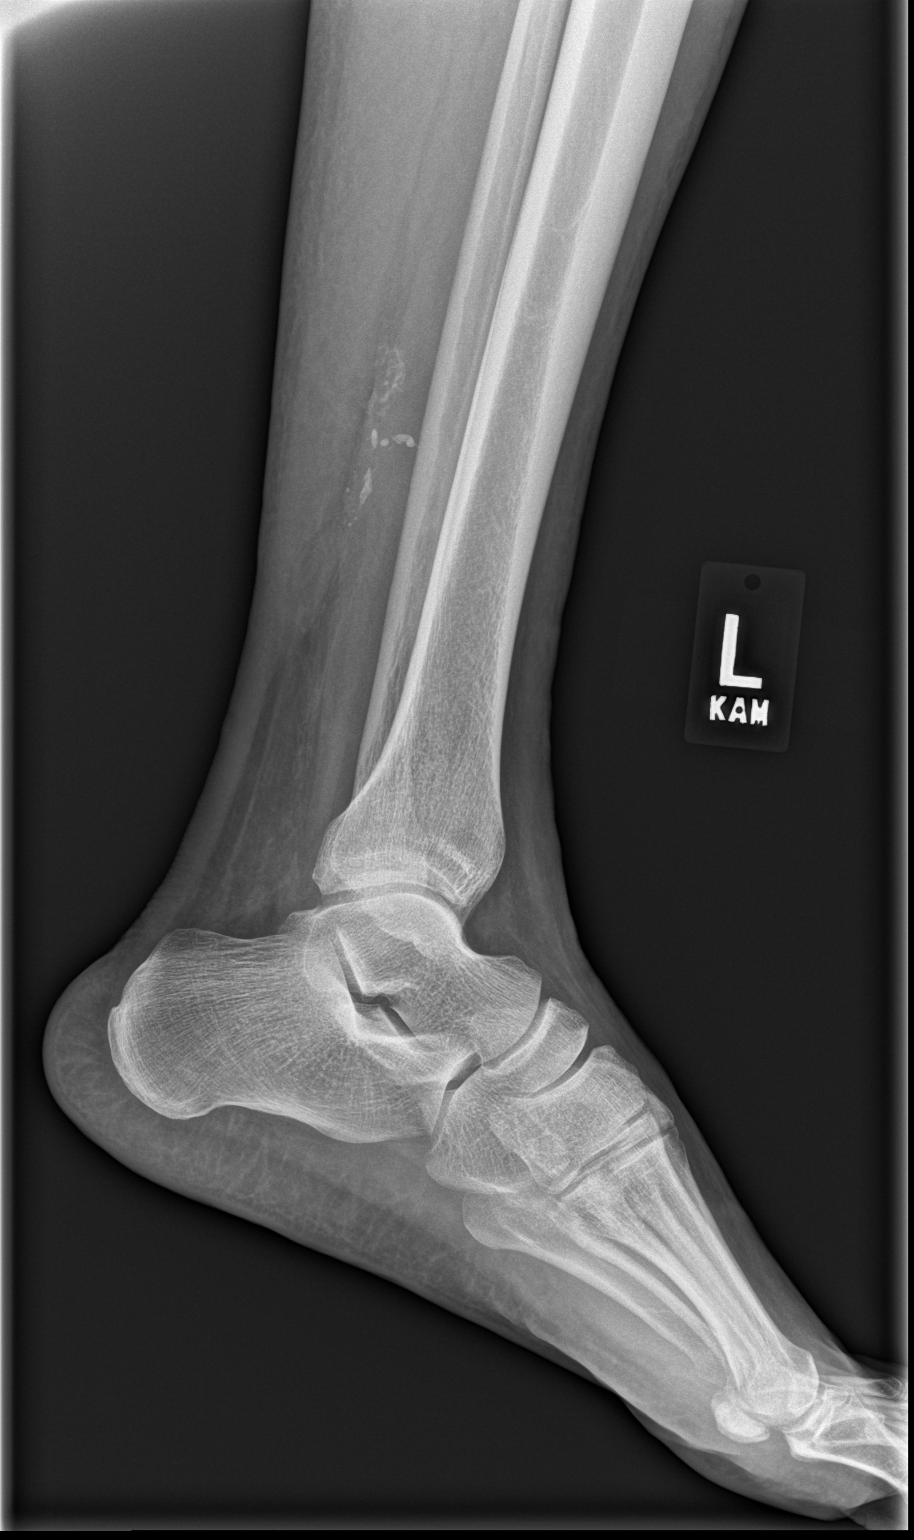

[3 of 3 positions shown; findings below may reference images not displayed]

FINDINGS: There is no evidence of fracture, dislocation, or joint effusion.
There is no evidence of arthropathy or other focal bone abnormality.
Soft tissues are unremarkable.
IMPRESSION: Negative.
